# Patient Record
Sex: Female | Born: 1940 | Race: White | Hispanic: No | Marital: Married
Health system: Southern US, Community
[De-identification: ages and names within clinical notes are randomized; demographics above are authoritative.]

---

## 2006-01-22 ENCOUNTER — Ambulatory Visit: Payer: Self-pay | Admitting: Internal Medicine

## 2006-05-19 ENCOUNTER — Ambulatory Visit: Payer: Self-pay | Admitting: Gastroenterology

## 2007-01-26 ENCOUNTER — Ambulatory Visit: Payer: Self-pay | Admitting: Internal Medicine

## 2008-03-15 ENCOUNTER — Ambulatory Visit: Payer: Self-pay | Admitting: Internal Medicine

## 2008-09-08 ENCOUNTER — Ambulatory Visit: Payer: Self-pay | Admitting: Oncology

## 2008-09-18 ENCOUNTER — Ambulatory Visit: Payer: Self-pay | Admitting: Internal Medicine

## 2008-09-25 ENCOUNTER — Ambulatory Visit: Payer: Self-pay | Admitting: Internal Medicine

## 2008-09-28 ENCOUNTER — Ambulatory Visit: Payer: Self-pay | Admitting: Oncology

## 2008-10-09 ENCOUNTER — Ambulatory Visit: Payer: Self-pay | Admitting: Oncology

## 2008-11-06 ENCOUNTER — Ambulatory Visit: Payer: Self-pay | Admitting: Oncology

## 2008-12-07 ENCOUNTER — Ambulatory Visit: Payer: Self-pay | Admitting: Oncology

## 2009-01-06 ENCOUNTER — Ambulatory Visit: Payer: Self-pay | Admitting: Oncology

## 2009-02-06 ENCOUNTER — Ambulatory Visit: Payer: Self-pay | Admitting: Oncology

## 2009-02-14 ENCOUNTER — Ambulatory Visit: Payer: Self-pay | Admitting: Oncology

## 2009-02-16 ENCOUNTER — Ambulatory Visit: Payer: Self-pay | Admitting: Oncology

## 2009-03-08 ENCOUNTER — Ambulatory Visit: Payer: Self-pay | Admitting: Oncology

## 2009-03-19 ENCOUNTER — Ambulatory Visit: Payer: Self-pay | Admitting: Internal Medicine

## 2009-03-30 ENCOUNTER — Ambulatory Visit: Payer: Self-pay | Admitting: Oncology

## 2009-04-05 ENCOUNTER — Ambulatory Visit: Payer: Self-pay | Admitting: Gastroenterology

## 2009-04-08 ENCOUNTER — Ambulatory Visit: Payer: Self-pay | Admitting: Oncology

## 2009-05-09 ENCOUNTER — Ambulatory Visit: Payer: Self-pay | Admitting: Oncology

## 2009-05-18 ENCOUNTER — Ambulatory Visit: Payer: Self-pay | Admitting: Oncology

## 2009-05-23 ENCOUNTER — Ambulatory Visit: Payer: Self-pay | Admitting: Oncology

## 2009-06-08 ENCOUNTER — Ambulatory Visit: Payer: Self-pay | Admitting: Oncology

## 2009-08-08 ENCOUNTER — Ambulatory Visit: Payer: Self-pay | Admitting: Oncology

## 2009-08-22 ENCOUNTER — Ambulatory Visit: Payer: Self-pay | Admitting: Oncology

## 2009-09-08 ENCOUNTER — Ambulatory Visit: Payer: Self-pay | Admitting: Oncology

## 2009-11-06 ENCOUNTER — Ambulatory Visit: Payer: Self-pay | Admitting: Oncology

## 2009-11-20 ENCOUNTER — Ambulatory Visit: Payer: Self-pay | Admitting: Oncology

## 2009-11-22 ENCOUNTER — Ambulatory Visit: Payer: Self-pay | Admitting: Oncology

## 2009-12-07 ENCOUNTER — Ambulatory Visit: Payer: Self-pay | Admitting: Oncology

## 2010-02-06 ENCOUNTER — Ambulatory Visit: Payer: Self-pay | Admitting: Oncology

## 2010-03-01 ENCOUNTER — Ambulatory Visit: Payer: Self-pay | Admitting: Oncology

## 2010-03-08 ENCOUNTER — Ambulatory Visit: Payer: Self-pay | Admitting: Oncology

## 2010-03-20 ENCOUNTER — Ambulatory Visit: Payer: Self-pay | Admitting: Internal Medicine

## 2010-05-09 ENCOUNTER — Ambulatory Visit: Payer: Self-pay | Admitting: Oncology

## 2010-05-31 ENCOUNTER — Ambulatory Visit: Payer: Self-pay | Admitting: Oncology

## 2010-06-04 ENCOUNTER — Ambulatory Visit: Payer: Self-pay | Admitting: Oncology

## 2010-06-08 ENCOUNTER — Ambulatory Visit: Payer: Self-pay | Admitting: Oncology

## 2010-07-16 ENCOUNTER — Ambulatory Visit: Payer: Self-pay | Admitting: Oncology

## 2010-08-08 ENCOUNTER — Ambulatory Visit: Payer: Self-pay | Admitting: Oncology

## 2010-10-04 ENCOUNTER — Ambulatory Visit: Payer: Self-pay | Admitting: Oncology

## 2010-10-09 ENCOUNTER — Ambulatory Visit: Payer: Self-pay | Admitting: Oncology

## 2010-11-19 ENCOUNTER — Ambulatory Visit: Payer: Self-pay | Admitting: Oncology

## 2010-11-20 ENCOUNTER — Ambulatory Visit: Payer: Self-pay | Admitting: Oncology

## 2010-11-20 LAB — CA 125: CA 125: 26.8 U/mL (ref 0.0–34.0)

## 2010-12-08 ENCOUNTER — Ambulatory Visit: Payer: Self-pay | Admitting: Oncology

## 2011-01-07 ENCOUNTER — Ambulatory Visit: Payer: Self-pay | Admitting: Oncology

## 2011-02-07 ENCOUNTER — Ambulatory Visit: Payer: Self-pay | Admitting: Oncology

## 2011-03-09 ENCOUNTER — Ambulatory Visit: Payer: Self-pay | Admitting: Oncology

## 2011-03-28 ENCOUNTER — Ambulatory Visit: Payer: Self-pay | Admitting: Surgery

## 2011-04-09 ENCOUNTER — Ambulatory Visit: Payer: Self-pay | Admitting: Oncology

## 2011-04-23 ENCOUNTER — Ambulatory Visit: Payer: Self-pay | Admitting: Oncology

## 2011-05-10 ENCOUNTER — Ambulatory Visit: Payer: Self-pay | Admitting: Oncology

## 2011-06-09 ENCOUNTER — Ambulatory Visit: Payer: Self-pay | Admitting: Oncology

## 2011-06-19 ENCOUNTER — Inpatient Hospital Stay: Payer: Self-pay | Admitting: Oncology

## 2011-07-10 ENCOUNTER — Ambulatory Visit: Payer: Self-pay | Admitting: Oncology

## 2011-08-18 ENCOUNTER — Ambulatory Visit: Payer: Self-pay | Admitting: Oncology

## 2011-09-09 ENCOUNTER — Ambulatory Visit: Payer: Self-pay | Admitting: Oncology

## 2011-11-18 ENCOUNTER — Ambulatory Visit: Payer: Self-pay | Admitting: Oncology

## 2011-11-18 LAB — COMPREHENSIVE METABOLIC PANEL
Albumin: 4 g/dL (ref 3.4–5.0)
Alkaline Phosphatase: 144 U/L — ABNORMAL HIGH (ref 50–136)
Bilirubin,Total: 0.3 mg/dL (ref 0.2–1.0)
Calcium, Total: 8.8 mg/dL (ref 8.5–10.1)
Chloride: 104 mmol/L (ref 98–107)
Co2: 30 mmol/L (ref 21–32)
Creatinine: 0.75 mg/dL (ref 0.60–1.30)
EGFR (Non-African Amer.): >60
Glucose: 136 mg/dL — ABNORMAL HIGH (ref 65–99)
Osmolality: 285 (ref 275–301)
Sodium: 140 mmol/L (ref 136–145)
Total Protein: 7.4 g/dL (ref 6.4–8.2)

## 2011-11-18 LAB — CBC CANCER CENTER
Basophil #: 0 x10 3/mm (ref 0.0–0.1)
Eosinophil #: 0 x10 3/mm (ref 0.0–0.7)
HCT: 37.8 % (ref 35.0–47.0)
Lymphocyte %: 15.3 %
MCH: 30.9 pg (ref 26.0–34.0)
MCHC: 34 g/dL (ref 32.0–36.0)
Neutrophil #: 3.1 x10 3/mm (ref 1.4–6.5)
Platelet: 140 x10 3/mm — ABNORMAL LOW (ref 150–440)
RDW: 13.1 % (ref 11.5–14.5)
WBC: 4.3 x10 3/mm (ref 3.6–11.0)

## 2011-11-18 LAB — FERRITIN: Ferritin (ARMC): 227 ng/mL (ref 8–388)

## 2011-11-18 LAB — IRON AND TIBC
Iron Bind.Cap.(Total): 216 ug/dL — ABNORMAL LOW (ref 250–450)
Unbound Iron-Bind.Cap.: 163 ug/dL

## 2011-11-18 LAB — FOLATE: Folic Acid: 41.8 ng/mL (ref 3.1–100.0)

## 2011-12-08 ENCOUNTER — Ambulatory Visit: Payer: Self-pay | Admitting: Oncology

## 2011-12-16 ENCOUNTER — Ambulatory Visit: Payer: Self-pay | Admitting: Oncology

## 2012-01-04 ENCOUNTER — Inpatient Hospital Stay: Payer: Self-pay | Admitting: *Deleted

## 2012-01-04 LAB — COMPREHENSIVE METABOLIC PANEL
Alkaline Phosphatase: 67 U/L (ref 50–136)
BUN: 25 mg/dL — ABNORMAL HIGH (ref 7–18)
Bilirubin,Total: 0.4 mg/dL (ref 0.2–1.0)
Calcium, Total: 8.7 mg/dL (ref 8.5–10.1)
Chloride: 107 mmol/L (ref 98–107)
EGFR (African American): >60
SGOT(AST): 15 U/L (ref 15–37)
SGPT (ALT): 12 U/L
Sodium: 142 mmol/L (ref 136–145)

## 2012-01-04 LAB — CBC WITH DIFFERENTIAL/PLATELET
Basophil %: 0.8 %
HGB: 11.7 g/dL — ABNORMAL LOW (ref 12.0–16.0)
Lymphocyte #: 0.4 10*3/uL — ABNORMAL LOW (ref 1.0–3.6)
Lymphocyte %: 11.6 %
MCH: 29.3 pg (ref 26.0–34.0)
Monocyte %: 13.8 %
Neutrophil #: 2.5 10*3/uL (ref 1.4–6.5)
Platelet: 100 10*3/uL — ABNORMAL LOW (ref 150–440)
WBC: 3.4 10*3/uL — ABNORMAL LOW (ref 3.6–11.0)

## 2012-01-05 LAB — CBC WITH DIFFERENTIAL/PLATELET
HGB: 11.9 g/dL — ABNORMAL LOW (ref 12.0–16.0)
Lymphocyte #: 0.3 10*3/uL — ABNORMAL LOW (ref 1.0–3.6)
MCH: 29.8 pg (ref 26.0–34.0)
MCHC: 33 g/dL (ref 32.0–36.0)
Monocyte %: 8.1 %
Neutrophil #: 3.8 10*3/uL (ref 1.4–6.5)
RDW: 13.3 % (ref 11.5–14.5)

## 2012-01-05 LAB — BASIC METABOLIC PANEL
BUN: 19 mg/dL — ABNORMAL HIGH (ref 7–18)
Calcium, Total: 8 mg/dL — ABNORMAL LOW (ref 8.5–10.1)
Chloride: 109 mmol/L — ABNORMAL HIGH (ref 98–107)
EGFR (African American): >60
Glucose: 111 mg/dL — ABNORMAL HIGH (ref 65–99)
Osmolality: 286 (ref 275–301)
Sodium: 142 mmol/L (ref 136–145)

## 2012-01-06 LAB — BASIC METABOLIC PANEL
Anion Gap: 5 — ABNORMAL LOW (ref 7–16)
Co2: 29 mmol/L (ref 21–32)
Creatinine: 0.65 mg/dL (ref 0.60–1.30)
EGFR (African American): >60
EGFR (Non-African Amer.): >60
Glucose: 94 mg/dL (ref 65–99)
Osmolality: 282 (ref 275–301)
Potassium: 4.3 mmol/L (ref 3.5–5.1)
Sodium: 142 mmol/L (ref 136–145)

## 2012-01-07 ENCOUNTER — Ambulatory Visit: Payer: Self-pay | Admitting: Oncology

## 2012-01-08 ENCOUNTER — Ambulatory Visit: Payer: Self-pay | Admitting: Oncology

## 2012-01-10 LAB — CULTURE, BLOOD (SINGLE)

## 2012-01-14 LAB — COMPREHENSIVE METABOLIC PANEL
Albumin: 3.3 g/dL — ABNORMAL LOW (ref 3.4–5.0)
Alkaline Phosphatase: 78 U/L (ref 50–136)
Anion Gap: 8 (ref 7–16)
BUN: 20 mg/dL — ABNORMAL HIGH (ref 7–18)
Bilirubin,Total: 0.3 mg/dL (ref 0.2–1.0)
Calcium, Total: 9.2 mg/dL (ref 8.5–10.1)
Chloride: 104 mmol/L (ref 98–107)
Co2: 31 mmol/L (ref 21–32)
EGFR (African American): >60
Glucose: 81 mg/dL (ref 65–99)
Osmolality: 287 (ref 275–301)
SGOT(AST): 11 U/L — ABNORMAL LOW (ref 15–37)
SGPT (ALT): 15 U/L
Sodium: 143 mmol/L (ref 136–145)
Total Protein: 6.6 g/dL (ref 6.4–8.2)

## 2012-01-14 LAB — CBC CANCER CENTER
Basophil #: 0 x10 3/mm (ref 0.0–0.1)
HCT: 37.5 % (ref 35.0–47.0)
Lymphocyte #: 0.6 x10 3/mm — ABNORMAL LOW (ref 1.0–3.6)
Lymphocyte %: 10.6 %
MCH: 29.2 pg (ref 26.0–34.0)
MCHC: 32.7 g/dL (ref 32.0–36.0)
MCV: 90 fL (ref 80–100)
Monocyte %: 10.8 %
Neutrophil #: 4.8 x10 3/mm (ref 1.4–6.5)
Neutrophil %: 77.7 %
Platelet: 193 x10 3/mm (ref 150–440)
RBC: 4.19 10*6/uL (ref 3.80–5.20)
WBC: 6.1 x10 3/mm (ref 3.6–11.0)

## 2012-02-07 ENCOUNTER — Ambulatory Visit: Payer: Self-pay | Admitting: Oncology

## 2012-02-12 ENCOUNTER — Ambulatory Visit: Payer: Self-pay | Admitting: Internal Medicine

## 2012-03-08 ENCOUNTER — Ambulatory Visit: Payer: Self-pay | Admitting: Oncology

## 2012-03-10 ENCOUNTER — Ambulatory Visit: Payer: Self-pay | Admitting: Oncology

## 2012-03-10 LAB — CBC CANCER CENTER
Basophil #: 0 x10 3/mm (ref 0.0–0.1)
HCT: 43.6 % (ref 35.0–47.0)
HGB: 13.9 g/dL (ref 12.0–16.0)
Lymphocyte #: 0.6 x10 3/mm — ABNORMAL LOW (ref 1.0–3.6)
Lymphocyte %: 8.5 %
MCH: 28.4 pg (ref 26.0–34.0)
Monocyte %: 6.9 %
Neutrophil %: 83.8 %
Platelet: 151 x10 3/mm (ref 150–440)
RBC: 4.9 10*6/uL (ref 3.80–5.20)
RDW: 14.7 % — ABNORMAL HIGH (ref 11.5–14.5)
WBC: 6.5 x10 3/mm (ref 3.6–11.0)

## 2012-03-10 LAB — COMPREHENSIVE METABOLIC PANEL
Albumin: 3.8 g/dL (ref 3.4–5.0)
Anion Gap: 6 — ABNORMAL LOW (ref 7–16)
BUN: 22 mg/dL — ABNORMAL HIGH (ref 7–18)
Calcium, Total: 9.3 mg/dL (ref 8.5–10.1)
Glucose: 137 mg/dL — ABNORMAL HIGH (ref 65–99)
Osmolality: 279 (ref 275–301)
Potassium: 4 mmol/L (ref 3.5–5.1)
SGOT(AST): 17 U/L (ref 15–37)
Sodium: 137 mmol/L (ref 136–145)
Total Protein: 6.9 g/dL (ref 6.4–8.2)

## 2012-04-07 LAB — COMPREHENSIVE METABOLIC PANEL
Albumin: 4 g/dL (ref 3.4–5.0)
Anion Gap: 8 (ref 7–16)
BUN: 25 mg/dL — ABNORMAL HIGH (ref 7–18)
Calcium, Total: 10.2 mg/dL — ABNORMAL HIGH (ref 8.5–10.1)
Chloride: 99 mmol/L (ref 98–107)
Co2: 30 mmol/L (ref 21–32)
Creatinine: 1.18 mg/dL (ref 0.60–1.30)
EGFR (African American): 54 — ABNORMAL LOW
Glucose: 91 mg/dL (ref 65–99)
Osmolality: 278 (ref 275–301)
Potassium: 4.5 mmol/L (ref 3.5–5.1)
SGOT(AST): 18 U/L (ref 15–37)
Sodium: 137 mmol/L (ref 136–145)

## 2012-04-07 LAB — CBC CANCER CENTER
Basophil #: 0 x10 3/mm (ref 0.0–0.1)
Basophil %: 0.6 %
Eosinophil #: 0.1 x10 3/mm (ref 0.0–0.7)
HCT: 45.7 % (ref 35.0–47.0)
Lymphocyte %: 13.2 %
MCHC: 32.8 g/dL (ref 32.0–36.0)
Monocyte %: 10.9 %
Neutrophil %: 74.4 %
RBC: 5.15 10*6/uL (ref 3.80–5.20)
WBC: 7.3 x10 3/mm (ref 3.6–11.0)

## 2012-04-08 ENCOUNTER — Ambulatory Visit: Payer: Self-pay | Admitting: Oncology

## 2012-05-12 ENCOUNTER — Ambulatory Visit: Payer: Self-pay | Admitting: Oncology

## 2012-05-12 LAB — COMPREHENSIVE METABOLIC PANEL
Albumin: 3.8 g/dL (ref 3.4–5.0)
Alkaline Phosphatase: 85 U/L (ref 50–136)
Anion Gap: 5 — ABNORMAL LOW (ref 7–16)
BUN: 28 mg/dL — ABNORMAL HIGH (ref 7–18)
Bilirubin,Total: 0.8 mg/dL (ref 0.2–1.0)
Co2: 31 mmol/L (ref 21–32)
Creatinine: 1.39 mg/dL — ABNORMAL HIGH (ref 0.60–1.30)
Glucose: 101 mg/dL — ABNORMAL HIGH (ref 65–99)
Osmolality: 279 (ref 275–301)
SGOT(AST): 19 U/L (ref 15–37)
SGPT (ALT): 16 U/L (ref 12–78)
Total Protein: 6.9 g/dL (ref 6.4–8.2)

## 2012-05-12 LAB — CBC CANCER CENTER
Basophil #: 0 x10 3/mm (ref 0.0–0.1)
Eosinophil #: 0 x10 3/mm (ref 0.0–0.7)
MCH: 29.2 pg (ref 26.0–34.0)
MCHC: 34 g/dL (ref 32.0–36.0)
Monocyte #: 0.7 x10 3/mm (ref 0.2–0.9)
Neutrophil #: 4.9 x10 3/mm (ref 1.4–6.5)
Neutrophil %: 75.2 %
Platelet: 182 x10 3/mm (ref 150–440)
RBC: 4.85 10*6/uL (ref 3.80–5.20)
RDW: 14.3 % (ref 11.5–14.5)

## 2012-06-08 ENCOUNTER — Ambulatory Visit: Payer: Self-pay | Admitting: Oncology

## 2012-06-09 LAB — CBC CANCER CENTER
Basophil %: 0.3 %
Eosinophil #: 0 x10 3/mm (ref 0.0–0.7)
HGB: 12.7 g/dL (ref 12.0–16.0)
MCH: 28.4 pg (ref 26.0–34.0)
Monocyte #: 0.4 x10 3/mm (ref 0.2–0.9)
Neutrophil #: 5.4 x10 3/mm (ref 1.4–6.5)
Neutrophil %: 87.8 %
Platelet: 187 x10 3/mm (ref 150–440)
RBC: 4.48 10*6/uL (ref 3.80–5.20)
WBC: 6.1 x10 3/mm (ref 3.6–11.0)

## 2012-06-09 LAB — COMPREHENSIVE METABOLIC PANEL
Albumin: 3.3 g/dL — ABNORMAL LOW (ref 3.4–5.0)
Alkaline Phosphatase: 100 U/L (ref 50–136)
BUN: 22 mg/dL — ABNORMAL HIGH (ref 7–18)
Chloride: 103 mmol/L (ref 98–107)
Co2: 25 mmol/L (ref 21–32)
Creatinine: 0.93 mg/dL (ref 0.60–1.30)
EGFR (African American): >60
EGFR (Non-African Amer.): >60
Osmolality: 280 (ref 275–301)
Potassium: 4.4 mmol/L (ref 3.5–5.1)
SGOT(AST): 19 U/L (ref 15–37)
SGPT (ALT): 18 U/L (ref 12–78)
Total Protein: 6.2 g/dL — ABNORMAL LOW (ref 6.4–8.2)

## 2012-07-05 ENCOUNTER — Ambulatory Visit: Payer: Self-pay | Admitting: Oncology

## 2012-07-07 LAB — CBC CANCER CENTER
Eosinophil #: 0.1 x10 3/mm (ref 0.0–0.7)
HCT: 39.7 % (ref 35.0–47.0)
HGB: 12.6 g/dL (ref 12.0–16.0)
Lymphocyte #: 0.8 x10 3/mm — ABNORMAL LOW (ref 1.0–3.6)
Lymphocyte %: 12.3 %
MCH: 28.4 pg (ref 26.0–34.0)
MCHC: 31.6 g/dL — ABNORMAL LOW (ref 32.0–36.0)
MCV: 90 fL (ref 80–100)
Monocyte #: 0.6 x10 3/mm (ref 0.2–0.9)
Monocyte %: 10.3 %
Neutrophil #: 4.6 x10 3/mm (ref 1.4–6.5)
Platelet: 187 x10 3/mm (ref 150–440)
RBC: 4.43 10*6/uL (ref 3.80–5.20)
RDW: 14.8 % — ABNORMAL HIGH (ref 11.5–14.5)
WBC: 6.1 x10 3/mm (ref 3.6–11.0)

## 2012-07-07 LAB — COMPREHENSIVE METABOLIC PANEL
Alkaline Phosphatase: 81 U/L (ref 50–136)
Anion Gap: 8 (ref 7–16)
BUN: 29 mg/dL — ABNORMAL HIGH (ref 7–18)
Bilirubin,Total: 0.6 mg/dL (ref 0.2–1.0)
Calcium, Total: 8.8 mg/dL (ref 8.5–10.1)
Chloride: 106 mmol/L (ref 98–107)
Co2: 28 mmol/L (ref 21–32)
Creatinine: 0.93 mg/dL (ref 0.60–1.30)
EGFR (African American): >60
Osmolality: 290 (ref 275–301)
Potassium: 4 mmol/L (ref 3.5–5.1)
SGOT(AST): 19 U/L (ref 15–37)
Total Protein: 6.1 g/dL — ABNORMAL LOW (ref 6.4–8.2)

## 2012-07-09 ENCOUNTER — Ambulatory Visit: Payer: Self-pay | Admitting: Oncology

## 2012-08-04 LAB — COMPREHENSIVE METABOLIC PANEL
Anion Gap: 4 — ABNORMAL LOW (ref 7–16)
BUN: 24 mg/dL — ABNORMAL HIGH (ref 7–18)
Calcium, Total: 9.1 mg/dL (ref 8.5–10.1)
Chloride: 105 mmol/L (ref 98–107)
Co2: 31 mmol/L (ref 21–32)
EGFR (African American): >60
EGFR (Non-African Amer.): 56 — ABNORMAL LOW
Potassium: 4 mmol/L (ref 3.5–5.1)
SGOT(AST): 24 U/L (ref 15–37)
Total Protein: 6.3 g/dL — ABNORMAL LOW (ref 6.4–8.2)

## 2012-08-04 LAB — CBC CANCER CENTER
Basophil #: 0 x10 3/mm (ref 0.0–0.1)
Basophil %: 0.4 %
Eosinophil #: 0 x10 3/mm (ref 0.0–0.7)
Lymphocyte #: 1 x10 3/mm (ref 1.0–3.6)
Lymphocyte %: 15 %
MCH: 29.5 pg (ref 26.0–34.0)
MCHC: 33.2 g/dL (ref 32.0–36.0)
Monocyte #: 0.8 x10 3/mm (ref 0.2–0.9)
Neutrophil %: 72 %
Platelet: 205 x10 3/mm (ref 150–440)
RBC: 4.44 10*6/uL (ref 3.80–5.20)
RDW: 14.7 % — ABNORMAL HIGH (ref 11.5–14.5)

## 2012-08-08 ENCOUNTER — Ambulatory Visit: Payer: Self-pay | Admitting: Oncology

## 2012-09-06 LAB — CBC CANCER CENTER
Basophil #: 0 x10 3/mm (ref 0.0–0.1)
Basophil %: 0.6 %
Eosinophil #: 0.1 x10 3/mm (ref 0.0–0.7)
HCT: 35.8 % (ref 35.0–47.0)
Lymphocyte #: 1.1 x10 3/mm (ref 1.0–3.6)
Lymphocyte %: 16.8 %
MCH: 29.3 pg (ref 26.0–34.0)
MCHC: 33.4 g/dL (ref 32.0–36.0)
MCV: 88 fL (ref 80–100)
Monocyte #: 0.9 x10 3/mm (ref 0.2–0.9)
Neutrophil #: 4.6 x10 3/mm (ref 1.4–6.5)
RDW: 14.2 % (ref 11.5–14.5)
WBC: 6.8 x10 3/mm (ref 3.6–11.0)

## 2012-09-06 LAB — COMPREHENSIVE METABOLIC PANEL
Anion Gap: 7 (ref 7–16)
BUN: 32 mg/dL — ABNORMAL HIGH (ref 7–18)
Chloride: 107 mmol/L (ref 98–107)
Co2: 28 mmol/L (ref 21–32)
EGFR (African American): 59 — ABNORMAL LOW
EGFR (Non-African Amer.): 51 — ABNORMAL LOW
Potassium: 4.2 mmol/L (ref 3.5–5.1)
SGOT(AST): 22 U/L (ref 15–37)
SGPT (ALT): 21 U/L (ref 12–78)
Sodium: 142 mmol/L (ref 136–145)
Total Protein: 5.6 g/dL — ABNORMAL LOW (ref 6.4–8.2)

## 2012-09-08 ENCOUNTER — Ambulatory Visit: Payer: Self-pay | Admitting: Oncology

## 2012-10-07 LAB — COMPREHENSIVE METABOLIC PANEL
Albumin: 3 g/dL — ABNORMAL LOW (ref 3.4–5.0)
Alkaline Phosphatase: 115 U/L (ref 50–136)
Anion Gap: 9 (ref 7–16)
BUN: 33 mg/dL — ABNORMAL HIGH (ref 7–18)
Bilirubin,Total: 0.9 mg/dL (ref 0.2–1.0)
Calcium, Total: 8.4 mg/dL — ABNORMAL LOW (ref 8.5–10.1)
Chloride: 103 mmol/L (ref 98–107)
Glucose: 115 mg/dL — ABNORMAL HIGH (ref 65–99)
Osmolality: 288 (ref 275–301)
Potassium: 4.5 mmol/L (ref 3.5–5.1)
SGPT (ALT): 22 U/L (ref 12–78)
Sodium: 140 mmol/L (ref 136–145)
Total Protein: 6.4 g/dL (ref 6.4–8.2)

## 2012-10-07 LAB — CBC CANCER CENTER
Lymphocyte %: 16.1 %
MCHC: 32.5 g/dL (ref 32.0–36.0)
MCV: 89 fL (ref 80–100)
Monocyte #: 1.7 x10 3/mm — ABNORMAL HIGH (ref 0.2–0.9)
Monocyte %: 16 %
Neutrophil #: 7 x10 3/mm — ABNORMAL HIGH (ref 1.4–6.5)
Neutrophil %: 66.9 %
Platelet: 216 x10 3/mm (ref 150–440)
RDW: 14.7 % — ABNORMAL HIGH (ref 11.5–14.5)
WBC: 10.4 x10 3/mm (ref 3.6–11.0)

## 2012-10-09 ENCOUNTER — Ambulatory Visit: Payer: Self-pay | Admitting: Oncology

## 2012-10-11 ENCOUNTER — Ambulatory Visit: Payer: Self-pay | Admitting: Oncology

## 2012-10-12 ENCOUNTER — Ambulatory Visit: Payer: Self-pay | Admitting: Oncology

## 2012-11-04 LAB — COMPREHENSIVE METABOLIC PANEL
Albumin: 2.6 g/dL — ABNORMAL LOW (ref 3.4–5.0)
Alkaline Phosphatase: 118 U/L (ref 50–136)
Anion Gap: 5 — ABNORMAL LOW (ref 7–16)
Calcium, Total: 9.4 mg/dL (ref 8.5–10.1)
Chloride: 103 mmol/L (ref 98–107)
Co2: 32 mmol/L (ref 21–32)
Creatinine: 1.29 mg/dL (ref 0.60–1.30)
EGFR (African American): 49 — ABNORMAL LOW
EGFR (Non-African Amer.): 42 — ABNORMAL LOW
Glucose: 88 mg/dL (ref 65–99)
Osmolality: 284 (ref 275–301)
Potassium: 4.4 mmol/L (ref 3.5–5.1)
SGOT(AST): 23 U/L (ref 15–37)
SGPT (ALT): 24 U/L (ref 12–78)
Sodium: 140 mmol/L (ref 136–145)
Total Protein: 6 g/dL — ABNORMAL LOW (ref 6.4–8.2)

## 2012-11-04 LAB — CBC CANCER CENTER
Basophil #: 0.1 x10 3/mm (ref 0.0–0.1)
Basophil %: 0.7 %
HGB: 13.4 g/dL (ref 12.0–16.0)
Lymphocyte #: 1.3 x10 3/mm (ref 1.0–3.6)
Lymphocyte %: 14.8 %
MCH: 29.2 pg (ref 26.0–34.0)
MCV: 88 fL (ref 80–100)
Monocyte #: 1.2 x10 3/mm — ABNORMAL HIGH (ref 0.2–0.9)
Monocyte %: 13.6 %
Neutrophil #: 6 x10 3/mm (ref 1.4–6.5)
Neutrophil %: 70 %
Platelet: 207 x10 3/mm (ref 150–440)
RDW: 15.5 % — ABNORMAL HIGH (ref 11.5–14.5)
WBC: 8.6 x10 3/mm (ref 3.6–11.0)

## 2012-11-06 ENCOUNTER — Ambulatory Visit: Payer: Self-pay | Admitting: Oncology

## 2012-12-02 LAB — CBC CANCER CENTER
Basophil #: 0 x10 3/mm (ref 0.0–0.1)
Basophil %: 0.7 %
Eosinophil #: 0.1 x10 3/mm (ref 0.0–0.7)
Eosinophil %: 1.1 %
HCT: 39.4 % (ref 35.0–47.0)
HGB: 12.9 g/dL (ref 12.0–16.0)
Lymphocyte %: 8 %
MCH: 29.1 pg (ref 26.0–34.0)
MCHC: 32.7 g/dL (ref 32.0–36.0)
MCV: 89 fL (ref 80–100)
Monocyte #: 0.7 x10 3/mm (ref 0.2–0.9)
Monocyte %: 9.7 %
Neutrophil #: 5.8 x10 3/mm (ref 1.4–6.5)
Platelet: 205 x10 3/mm (ref 150–440)
RDW: 15 % — ABNORMAL HIGH (ref 11.5–14.5)
WBC: 7.3 x10 3/mm (ref 3.6–11.0)

## 2012-12-02 LAB — COMPREHENSIVE METABOLIC PANEL
Alkaline Phosphatase: 129 U/L (ref 50–136)
Anion Gap: 8 (ref 7–16)
Bilirubin,Total: 0.6 mg/dL (ref 0.2–1.0)
Co2: 28 mmol/L (ref 21–32)
Creatinine: 1.51 mg/dL — ABNORMAL HIGH (ref 0.60–1.30)
EGFR (African American): 40 — ABNORMAL LOW
Glucose: 106 mg/dL — ABNORMAL HIGH (ref 65–99)
Osmolality: 292 (ref 275–301)
Potassium: 3.6 mmol/L (ref 3.5–5.1)
SGOT(AST): 23 U/L (ref 15–37)
Sodium: 142 mmol/L (ref 136–145)

## 2012-12-07 ENCOUNTER — Ambulatory Visit: Payer: Self-pay | Admitting: Oncology

## 2012-12-20 LAB — CBC CANCER CENTER
Basophil %: 0.4 %
Eosinophil #: 0 x10 3/mm (ref 0.0–0.7)
Eosinophil %: 0.3 %
HCT: 41.1 % (ref 35.0–47.0)
Lymphocyte #: 0.7 x10 3/mm — ABNORMAL LOW (ref 1.0–3.6)
Lymphocyte %: 6.7 %
MCH: 29 pg (ref 26.0–34.0)
Monocyte #: 0.9 x10 3/mm (ref 0.2–0.9)
Monocyte %: 9.1 %
Neutrophil #: 8.2 x10 3/mm — ABNORMAL HIGH (ref 1.4–6.5)
Neutrophil %: 83.5 %
Platelet: 199 x10 3/mm (ref 150–440)
RBC: 4.58 10*6/uL (ref 3.80–5.20)
RDW: 15.4 % — ABNORMAL HIGH (ref 11.5–14.5)
WBC: 9.8 x10 3/mm (ref 3.6–11.0)

## 2012-12-20 LAB — COMPREHENSIVE METABOLIC PANEL
Anion Gap: 8 (ref 7–16)
BUN: 32 mg/dL — ABNORMAL HIGH (ref 7–18)
Bilirubin,Total: 0.9 mg/dL (ref 0.2–1.0)
Chloride: 106 mmol/L (ref 98–107)
Creatinine: 1.37 mg/dL — ABNORMAL HIGH (ref 0.60–1.30)
EGFR (Non-African Amer.): 39 — ABNORMAL LOW
Glucose: 135 mg/dL — ABNORMAL HIGH (ref 65–99)
Osmolality: 288 (ref 275–301)
SGOT(AST): 26 U/L (ref 15–37)
Sodium: 140 mmol/L (ref 136–145)

## 2013-01-05 LAB — COMPREHENSIVE METABOLIC PANEL
Albumin: 2.5 g/dL — ABNORMAL LOW (ref 3.4–5.0)
Alkaline Phosphatase: 78 U/L (ref 50–136)
Anion Gap: 5 — ABNORMAL LOW (ref 7–16)
Bilirubin,Total: 0.3 mg/dL (ref 0.2–1.0)
Co2: 33 mmol/L — ABNORMAL HIGH (ref 21–32)
Creatinine: 1.09 mg/dL (ref 0.60–1.30)
EGFR (African American): 59 — ABNORMAL LOW
SGOT(AST): 26 U/L (ref 15–37)
SGPT (ALT): 24 U/L (ref 12–78)
Total Protein: 6.7 g/dL (ref 6.4–8.2)

## 2013-01-05 LAB — CBC CANCER CENTER
Eosinophil #: 0.1 x10 3/mm (ref 0.0–0.7)
HCT: 34.9 % — ABNORMAL LOW (ref 35.0–47.0)
Lymphocyte #: 0.3 x10 3/mm — ABNORMAL LOW (ref 1.0–3.6)
Lymphocyte %: 2.3 %
MCH: 29.2 pg (ref 26.0–34.0)
MCHC: 32.5 g/dL (ref 32.0–36.0)
Monocyte %: 7.3 %
Neutrophil %: 88.9 %
RBC: 3.89 10*6/uL (ref 3.80–5.20)
WBC: 14 x10 3/mm — ABNORMAL HIGH (ref 3.6–11.0)

## 2013-01-06 ENCOUNTER — Ambulatory Visit: Payer: Self-pay | Admitting: Oncology

## 2013-02-02 LAB — COMPREHENSIVE METABOLIC PANEL
Alkaline Phosphatase: 76 U/L (ref 50–136)
Creatinine: 1.7 mg/dL — ABNORMAL HIGH (ref 0.60–1.30)
EGFR (African American): 35 — ABNORMAL LOW
EGFR (Non-African Amer.): 30 — ABNORMAL LOW
Glucose: 82 mg/dL (ref 65–99)
Osmolality: 284 (ref 275–301)
SGOT(AST): 14 U/L — ABNORMAL LOW (ref 15–37)
SGPT (ALT): 17 U/L (ref 12–78)
Sodium: 138 mmol/L (ref 136–145)

## 2013-02-02 LAB — CBC CANCER CENTER
Basophil #: 0.1 x10 3/mm (ref 0.0–0.1)
Eosinophil #: 0.2 x10 3/mm (ref 0.0–0.7)
Eosinophil %: 2.3 %
Lymphocyte %: 9.8 %
MCHC: 32.3 g/dL (ref 32.0–36.0)
MCV: 90 fL (ref 80–100)
Monocyte #: 1.1 x10 3/mm — ABNORMAL HIGH (ref 0.2–0.9)
Neutrophil %: 76.4 %
Platelet: 271 x10 3/mm (ref 150–440)
RBC: 3.94 10*6/uL (ref 3.80–5.20)

## 2013-02-06 ENCOUNTER — Ambulatory Visit: Payer: Self-pay | Admitting: Oncology

## 2013-03-02 LAB — CBC CANCER CENTER
Basophil %: 0.7 %
HGB: 12.8 g/dL (ref 12.0–16.0)
Lymphocyte #: 0.7 x10 3/mm — ABNORMAL LOW (ref 1.0–3.6)
MCH: 30.4 pg (ref 26.0–34.0)
Monocyte #: 0.7 x10 3/mm (ref 0.2–0.9)
Neutrophil %: 86.2 %
Platelet: 214 x10 3/mm (ref 150–440)
RBC: 4.23 10*6/uL (ref 3.80–5.20)
WBC: 10.9 x10 3/mm (ref 3.6–11.0)

## 2013-03-02 LAB — COMPREHENSIVE METABOLIC PANEL
Albumin: 3.4 g/dL (ref 3.4–5.0)
Alkaline Phosphatase: 78 U/L (ref 50–136)
Anion Gap: 9 (ref 7–16)
BUN: 44 mg/dL — ABNORMAL HIGH (ref 7–18)
Bilirubin,Total: 0.3 mg/dL (ref 0.2–1.0)
Co2: 33 mmol/L — ABNORMAL HIGH (ref 21–32)
Creatinine: 2.27 mg/dL — ABNORMAL HIGH (ref 0.60–1.30)
EGFR (African American): 24 — ABNORMAL LOW
Osmolality: 293 (ref 275–301)
SGOT(AST): 13 U/L — ABNORMAL LOW (ref 15–37)
SGPT (ALT): 17 U/L (ref 12–78)
Sodium: 140 mmol/L (ref 136–145)
Total Protein: 7 g/dL (ref 6.4–8.2)

## 2013-03-08 ENCOUNTER — Ambulatory Visit: Payer: Self-pay | Admitting: Oncology

## 2013-03-16 LAB — CBC CANCER CENTER
Basophil #: 0.1 x10 3/mm (ref 0.0–0.1)
Basophil %: 0.7 %
Eosinophil #: 0.1 x10 3/mm (ref 0.0–0.7)
HGB: 12.3 g/dL (ref 12.0–16.0)
Lymphocyte #: 0.7 x10 3/mm — ABNORMAL LOW (ref 1.0–3.6)
Lymphocyte %: 6.9 %
MCH: 29.7 pg (ref 26.0–34.0)
MCV: 88 fL (ref 80–100)
Monocyte #: 0.9 x10 3/mm (ref 0.2–0.9)
Monocyte %: 9.1 %
Neutrophil %: 82.7 %
Platelet: 222 x10 3/mm (ref 150–440)
RBC: 4.14 10*6/uL (ref 3.80–5.20)
RDW: 15.6 % — ABNORMAL HIGH (ref 11.5–14.5)
WBC: 10.3 x10 3/mm (ref 3.6–11.0)

## 2013-03-16 LAB — COMPREHENSIVE METABOLIC PANEL
Anion Gap: 9 (ref 7–16)
BUN: 29 mg/dL — ABNORMAL HIGH (ref 7–18)
Bilirubin,Total: 0.3 mg/dL (ref 0.2–1.0)
Calcium, Total: 8 mg/dL — ABNORMAL LOW (ref 8.5–10.1)
Chloride: 104 mmol/L (ref 98–107)
Co2: 27 mmol/L (ref 21–32)
Creatinine: 1.36 mg/dL — ABNORMAL HIGH (ref 0.60–1.30)
EGFR (African American): 45 — ABNORMAL LOW
Osmolality: 286 (ref 275–301)
Potassium: 3.1 mmol/L — ABNORMAL LOW (ref 3.5–5.1)
SGOT(AST): 21 U/L (ref 15–37)
SGPT (ALT): 18 U/L (ref 12–78)
Total Protein: 7.4 g/dL (ref 6.4–8.2)

## 2013-03-31 LAB — COMPREHENSIVE METABOLIC PANEL
Alkaline Phosphatase: 77 U/L (ref 50–136)
Anion Gap: 6 — ABNORMAL LOW (ref 7–16)
Calcium, Total: 9.5 mg/dL (ref 8.5–10.1)
Chloride: 103 mmol/L (ref 98–107)
Co2: 33 mmol/L — ABNORMAL HIGH (ref 21–32)
Creatinine: 1.93 mg/dL — ABNORMAL HIGH (ref 0.60–1.30)
Potassium: 3.8 mmol/L (ref 3.5–5.1)
SGOT(AST): 12 U/L — ABNORMAL LOW (ref 15–37)
Sodium: 142 mmol/L (ref 136–145)

## 2013-03-31 LAB — CBC CANCER CENTER
Basophil %: 0.7 %
Eosinophil %: 1.2 %
HCT: 37.8 % (ref 35.0–47.0)
HGB: 12.8 g/dL (ref 12.0–16.0)
Lymphocyte #: 0.8 x10 3/mm — ABNORMAL LOW (ref 1.0–3.6)
Lymphocyte %: 8.6 %
Monocyte #: 1.1 x10 3/mm — ABNORMAL HIGH (ref 0.2–0.9)
Monocyte %: 11.7 %
Neutrophil %: 77.8 %
Platelet: 217 x10 3/mm (ref 150–440)
RDW: 15.4 % — ABNORMAL HIGH (ref 11.5–14.5)

## 2013-04-08 ENCOUNTER — Ambulatory Visit: Payer: Self-pay | Admitting: Oncology

## 2013-04-27 LAB — COMPREHENSIVE METABOLIC PANEL
Albumin: 3.6 g/dL (ref 3.4–5.0)
Alkaline Phosphatase: 90 U/L (ref 50–136)
Anion Gap: 7 (ref 7–16)
BUN: 27 mg/dL — ABNORMAL HIGH (ref 7–18)
Bilirubin,Total: 0.4 mg/dL (ref 0.2–1.0)
Calcium, Total: 9.1 mg/dL (ref 8.5–10.1)
Chloride: 99 mmol/L (ref 98–107)
Creatinine: 1.45 mg/dL — ABNORMAL HIGH (ref 0.60–1.30)
EGFR (African American): 42 — ABNORMAL LOW
EGFR (Non-African Amer.): 36 — ABNORMAL LOW
Glucose: 94 mg/dL (ref 65–99)
Sodium: 140 mmol/L (ref 136–145)
Total Protein: 7 g/dL (ref 6.4–8.2)

## 2013-04-27 LAB — CBC CANCER CENTER
Basophil %: 1.1 %
Eosinophil #: 0.2 x10 3/mm (ref 0.0–0.7)
Eosinophil %: 2 %
HGB: 12.3 g/dL (ref 12.0–16.0)
Lymphocyte #: 0.8 x10 3/mm — ABNORMAL LOW (ref 1.0–3.6)
Lymphocyte %: 9.8 %
MCH: 29.4 pg (ref 26.0–34.0)
Monocyte #: 1 x10 3/mm — ABNORMAL HIGH (ref 0.2–0.9)
WBC: 8.6 x10 3/mm (ref 3.6–11.0)

## 2013-05-09 ENCOUNTER — Ambulatory Visit: Payer: Self-pay | Admitting: Oncology

## 2013-06-08 ENCOUNTER — Ambulatory Visit: Payer: Self-pay | Admitting: Oncology

## 2013-06-22 LAB — COMPREHENSIVE METABOLIC PANEL
Alkaline Phosphatase: 144 U/L — ABNORMAL HIGH (ref 50–136)
Anion Gap: 7 (ref 7–16)
BUN: 32 mg/dL — ABNORMAL HIGH (ref 7–18)
Co2: 36 mmol/L — ABNORMAL HIGH (ref 21–32)
Creatinine: 1.79 mg/dL — ABNORMAL HIGH (ref 0.60–1.30)
EGFR (African American): 32 — ABNORMAL LOW
EGFR (Non-African Amer.): 28 — ABNORMAL LOW
Glucose: 106 mg/dL — ABNORMAL HIGH (ref 65–99)
Osmolality: 279 (ref 275–301)
SGPT (ALT): 28 U/L (ref 12–78)
Total Protein: 7.2 g/dL (ref 6.4–8.2)

## 2013-06-22 LAB — CBC CANCER CENTER
Basophil #: 0.1 x10 3/mm (ref 0.0–0.1)
Eosinophil #: 0.2 x10 3/mm (ref 0.0–0.7)
Eosinophil %: 3.1 %
HCT: 35.6 % (ref 35.0–47.0)
Lymphocyte #: 0.7 x10 3/mm — ABNORMAL LOW (ref 1.0–3.6)
Lymphocyte %: 8.4 %
Monocyte #: 1.2 x10 3/mm — ABNORMAL HIGH (ref 0.2–0.9)
Monocyte %: 14.7 %
Neutrophil #: 5.8 x10 3/mm (ref 1.4–6.5)
Platelet: 256 x10 3/mm (ref 150–440)
RDW: 14.9 % — ABNORMAL HIGH (ref 11.5–14.5)
WBC: 7.9 x10 3/mm (ref 3.6–11.0)

## 2013-07-09 ENCOUNTER — Ambulatory Visit: Payer: Self-pay | Admitting: Oncology

## 2013-07-18 LAB — CBC CANCER CENTER
Basophil #: 0 x10 3/mm (ref 0.0–0.1)
Basophil %: 0.5 %
Eosinophil %: 2.9 %
HCT: 38.6 % (ref 35.0–47.0)
HGB: 12.3 g/dL (ref 12.0–16.0)
Lymphocyte %: 7.1 %
Monocyte %: 8.9 %
Neutrophil %: 80.6 %
Platelet: 176 x10 3/mm (ref 150–440)
RBC: 4.48 10*6/uL (ref 3.80–5.20)
RDW: 15.3 % — ABNORMAL HIGH (ref 11.5–14.5)
WBC: 8.4 x10 3/mm (ref 3.6–11.0)

## 2013-07-18 LAB — COMPREHENSIVE METABOLIC PANEL
Alkaline Phosphatase: 120 U/L (ref 50–136)
Anion Gap: 6 — ABNORMAL LOW (ref 7–16)
BUN: 17 mg/dL (ref 7–18)
Bilirubin,Total: 0.3 mg/dL (ref 0.2–1.0)
Calcium, Total: 8.6 mg/dL (ref 8.5–10.1)
Chloride: 103 mmol/L (ref 98–107)
EGFR (African American): 46 — ABNORMAL LOW
Glucose: 122 mg/dL — ABNORMAL HIGH (ref 65–99)
Osmolality: 284 (ref 275–301)
Potassium: 3.8 mmol/L (ref 3.5–5.1)
SGOT(AST): 24 U/L (ref 15–37)
Sodium: 141 mmol/L (ref 136–145)

## 2013-08-08 ENCOUNTER — Ambulatory Visit: Payer: Self-pay | Admitting: Oncology

## 2013-08-15 LAB — COMPREHENSIVE METABOLIC PANEL
Anion Gap: 4 — ABNORMAL LOW (ref 7–16)
Bilirubin,Total: 0.3 mg/dL (ref 0.2–1.0)
Calcium, Total: 9.2 mg/dL (ref 8.5–10.1)
Creatinine: 1.46 mg/dL — ABNORMAL HIGH (ref 0.60–1.30)
Glucose: 87 mg/dL (ref 65–99)
SGOT(AST): 22 U/L (ref 15–37)
SGPT (ALT): 25 U/L (ref 12–78)
Total Protein: 7.2 g/dL (ref 6.4–8.2)

## 2013-08-15 LAB — CBC CANCER CENTER
Basophil #: 0.1 x10 3/mm (ref 0.0–0.1)
Eosinophil #: 0.2 x10 3/mm (ref 0.0–0.7)
Lymphocyte #: 0.6 x10 3/mm — ABNORMAL LOW (ref 1.0–3.6)
Monocyte %: 9.2 %
Neutrophil %: 78.7 %
RBC: 4.53 10*6/uL (ref 3.80–5.20)
WBC: 7 x10 3/mm (ref 3.6–11.0)

## 2013-09-08 ENCOUNTER — Ambulatory Visit: Payer: Self-pay | Admitting: Oncology

## 2013-09-12 LAB — COMPREHENSIVE METABOLIC PANEL
ALBUMIN: 3.1 g/dL — AB (ref 3.4–5.0)
ALK PHOS: 98 U/L
Anion Gap: 6 — ABNORMAL LOW (ref 7–16)
BILIRUBIN TOTAL: 0.3 mg/dL (ref 0.2–1.0)
BUN: 29 mg/dL — ABNORMAL HIGH (ref 7–18)
CALCIUM: 9.2 mg/dL (ref 8.5–10.1)
Chloride: 100 mmol/L (ref 98–107)
Co2: 33 mmol/L — ABNORMAL HIGH (ref 21–32)
Creatinine: 1.58 mg/dL — ABNORMAL HIGH (ref 0.60–1.30)
EGFR (African American): 38 — ABNORMAL LOW
EGFR (Non-African Amer.): 33 — ABNORMAL LOW
Glucose: 117 mg/dL — ABNORMAL HIGH (ref 65–99)
Osmolality: 284 (ref 275–301)
Potassium: 3.7 mmol/L (ref 3.5–5.1)
SGOT(AST): 27 U/L (ref 15–37)
SGPT (ALT): 30 U/L (ref 12–78)
Sodium: 139 mmol/L (ref 136–145)
Total Protein: 7.3 g/dL (ref 6.4–8.2)

## 2013-09-12 LAB — CBC CANCER CENTER
BASOS ABS: 0.1 x10 3/mm (ref 0.0–0.1)
Basophil %: 0.7 %
EOS ABS: 0.1 x10 3/mm (ref 0.0–0.7)
Eosinophil %: 0.8 %
HCT: 37 % (ref 35.0–47.0)
HGB: 11.6 g/dL — ABNORMAL LOW (ref 12.0–16.0)
LYMPHS ABS: 0.6 x10 3/mm — AB (ref 1.0–3.6)
Lymphocyte %: 4.4 %
MCH: 25.7 pg — ABNORMAL LOW (ref 26.0–34.0)
MCHC: 31.3 g/dL — ABNORMAL LOW (ref 32.0–36.0)
MCV: 82 fL (ref 80–100)
Monocyte #: 0.9 x10 3/mm (ref 0.2–0.9)
Monocyte %: 6.8 %
NEUTROS ABS: 11.3 x10 3/mm — AB (ref 1.4–6.5)
NEUTROS PCT: 87.3 %
Platelet: 254 x10 3/mm (ref 150–440)
RBC: 4.51 10*6/uL (ref 3.80–5.20)
RDW: 16.7 % — AB (ref 11.5–14.5)
WBC: 13 x10 3/mm — AB (ref 3.6–11.0)

## 2013-10-06 LAB — COMPREHENSIVE METABOLIC PANEL
ALT: 23 U/L (ref 12–78)
ANION GAP: 7 (ref 7–16)
AST: 24 U/L (ref 15–37)
Albumin: 3.4 g/dL (ref 3.4–5.0)
Alkaline Phosphatase: 101 U/L
BUN: 19 mg/dL — ABNORMAL HIGH (ref 7–18)
Bilirubin,Total: 0.4 mg/dL (ref 0.2–1.0)
CALCIUM: 12 mg/dL — AB (ref 8.5–10.1)
CO2: 30 mmol/L (ref 21–32)
Chloride: 104 mmol/L (ref 98–107)
Creatinine: 1.29 mg/dL (ref 0.60–1.30)
EGFR (African American): 48 — ABNORMAL LOW
GFR CALC NON AF AMER: 42 — AB
GLUCOSE: 89 mg/dL (ref 65–99)
Osmolality: 283 (ref 275–301)
POTASSIUM: 3.8 mmol/L (ref 3.5–5.1)
Sodium: 141 mmol/L (ref 136–145)
TOTAL PROTEIN: 7.3 g/dL (ref 6.4–8.2)

## 2013-10-06 LAB — CBC CANCER CENTER
Basophil #: 0.1 x10 3/mm (ref 0.0–0.1)
Basophil %: 1.6 %
EOS ABS: 0.8 x10 3/mm — AB (ref 0.0–0.7)
Eosinophil %: 12 %
HCT: 36 % (ref 35.0–47.0)
HGB: 11.4 g/dL — ABNORMAL LOW (ref 12.0–16.0)
Lymphocyte #: 0.9 x10 3/mm — ABNORMAL LOW (ref 1.0–3.6)
Lymphocyte %: 13 %
MCH: 25.7 pg — ABNORMAL LOW (ref 26.0–34.0)
MCHC: 31.7 g/dL — ABNORMAL LOW (ref 32.0–36.0)
MCV: 81 fL (ref 80–100)
Monocyte #: 1 x10 3/mm — ABNORMAL HIGH (ref 0.2–0.9)
Monocyte %: 15.2 %
NEUTROS ABS: 3.8 x10 3/mm (ref 1.4–6.5)
Neutrophil %: 58.2 %
Platelet: 240 x10 3/mm (ref 150–440)
RBC: 4.45 10*6/uL (ref 3.80–5.20)
RDW: 16.8 % — AB (ref 11.5–14.5)
WBC: 6.6 x10 3/mm (ref 3.6–11.0)

## 2013-10-09 ENCOUNTER — Ambulatory Visit: Payer: Self-pay | Admitting: Oncology

## 2013-11-06 ENCOUNTER — Ambulatory Visit: Payer: Self-pay | Admitting: Oncology

## 2013-11-07 LAB — COMPREHENSIVE METABOLIC PANEL
ALBUMIN: 3.5 g/dL (ref 3.4–5.0)
ALT: 14 U/L (ref 12–78)
ANION GAP: 11 (ref 7–16)
Alkaline Phosphatase: 93 U/L
BILIRUBIN TOTAL: 0.3 mg/dL (ref 0.2–1.0)
BUN: 21 mg/dL — ABNORMAL HIGH (ref 7–18)
CALCIUM: 9.2 mg/dL (ref 8.5–10.1)
CHLORIDE: 103 mmol/L (ref 98–107)
CO2: 26 mmol/L (ref 21–32)
Creatinine: 1.4 mg/dL — ABNORMAL HIGH (ref 0.60–1.30)
EGFR (African American): 44 — ABNORMAL LOW
EGFR (Non-African Amer.): 38 — ABNORMAL LOW
GLUCOSE: 145 mg/dL — AB (ref 65–99)
OSMOLALITY: 285 (ref 275–301)
Potassium: 3.5 mmol/L (ref 3.5–5.1)
SGOT(AST): 17 U/L (ref 15–37)
SODIUM: 140 mmol/L (ref 136–145)
Total Protein: 7.2 g/dL (ref 6.4–8.2)

## 2013-11-07 LAB — CBC CANCER CENTER
Basophil #: 0.1 x10 3/mm (ref 0.0–0.1)
Basophil %: 0.6 %
EOS ABS: 0.7 x10 3/mm (ref 0.0–0.7)
Eosinophil %: 7.7 %
HCT: 38 % (ref 35.0–47.0)
HGB: 11.8 g/dL — ABNORMAL LOW (ref 12.0–16.0)
LYMPHS PCT: 10.6 %
Lymphocyte #: 0.9 x10 3/mm — ABNORMAL LOW (ref 1.0–3.6)
MCH: 25.3 pg — ABNORMAL LOW (ref 26.0–34.0)
MCHC: 31 g/dL — ABNORMAL LOW (ref 32.0–36.0)
MCV: 82 fL (ref 80–100)
Monocyte #: 0.9 x10 3/mm (ref 0.2–0.9)
Monocyte %: 9.6 %
NEUTROS PCT: 71.5 %
Neutrophil #: 6.4 x10 3/mm (ref 1.4–6.5)
PLATELETS: 246 x10 3/mm (ref 150–440)
RBC: 4.66 10*6/uL (ref 3.80–5.20)
RDW: 17.2 % — AB (ref 11.5–14.5)
WBC: 8.9 x10 3/mm (ref 3.6–11.0)

## 2013-12-05 LAB — CBC CANCER CENTER
Basophil #: 0.1 x10 3/mm (ref 0.0–0.1)
Basophil %: 0.7 %
EOS ABS: 0.6 x10 3/mm (ref 0.0–0.7)
EOS PCT: 6 %
HCT: 39.4 % (ref 35.0–47.0)
HGB: 12.3 g/dL (ref 12.0–16.0)
LYMPHS PCT: 7.9 %
Lymphocyte #: 0.8 x10 3/mm — ABNORMAL LOW (ref 1.0–3.6)
MCH: 25.4 pg — ABNORMAL LOW (ref 26.0–34.0)
MCHC: 31.4 g/dL — AB (ref 32.0–36.0)
MCV: 81 fL (ref 80–100)
MONO ABS: 0.9 x10 3/mm (ref 0.2–0.9)
Monocyte %: 8.8 %
NEUTROS PCT: 76.6 %
Neutrophil #: 8.2 x10 3/mm — ABNORMAL HIGH (ref 1.4–6.5)
Platelet: 223 x10 3/mm (ref 150–440)
RBC: 4.87 10*6/uL (ref 3.80–5.20)
RDW: 17.4 % — AB (ref 11.5–14.5)
WBC: 10.7 x10 3/mm (ref 3.6–11.0)

## 2013-12-05 LAB — COMPREHENSIVE METABOLIC PANEL
ANION GAP: 11 (ref 7–16)
Albumin: 3.7 g/dL (ref 3.4–5.0)
Alkaline Phosphatase: 91 U/L
BUN: 33 mg/dL — ABNORMAL HIGH (ref 7–18)
Bilirubin,Total: 0.5 mg/dL (ref 0.2–1.0)
CALCIUM: 9.6 mg/dL (ref 8.5–10.1)
CHLORIDE: 101 mmol/L (ref 98–107)
CO2: 28 mmol/L (ref 21–32)
Creatinine: 1.59 mg/dL — ABNORMAL HIGH (ref 0.60–1.30)
GFR CALC AF AMER: 37 — AB
GFR CALC NON AF AMER: 32 — AB
Glucose: 115 mg/dL — ABNORMAL HIGH (ref 65–99)
OSMOLALITY: 288 (ref 275–301)
Potassium: 3.4 mmol/L — ABNORMAL LOW (ref 3.5–5.1)
SGOT(AST): 18 U/L (ref 15–37)
SGPT (ALT): 17 U/L (ref 12–78)
SODIUM: 140 mmol/L (ref 136–145)
Total Protein: 7.5 g/dL (ref 6.4–8.2)

## 2013-12-07 ENCOUNTER — Ambulatory Visit: Payer: Self-pay | Admitting: Oncology

## 2014-01-02 LAB — CBC CANCER CENTER
BASOS ABS: 0.1 x10 3/mm (ref 0.0–0.1)
Basophil %: 1.1 %
EOS ABS: 0.6 x10 3/mm (ref 0.0–0.7)
Eosinophil %: 5.1 %
HCT: 38.9 % (ref 35.0–47.0)
HGB: 12.3 g/dL (ref 12.0–16.0)
LYMPHS ABS: 0.9 x10 3/mm — AB (ref 1.0–3.6)
LYMPHS PCT: 8.3 %
MCH: 25.1 pg — ABNORMAL LOW (ref 26.0–34.0)
MCHC: 31.7 g/dL — ABNORMAL LOW (ref 32.0–36.0)
MCV: 79 fL — AB (ref 80–100)
MONOS PCT: 10.2 %
Monocyte #: 1.1 x10 3/mm — ABNORMAL HIGH (ref 0.2–0.9)
NEUTROS PCT: 75.3 %
Neutrophil #: 8.2 x10 3/mm — ABNORMAL HIGH (ref 1.4–6.5)
Platelet: 244 x10 3/mm (ref 150–440)
RBC: 4.91 10*6/uL (ref 3.80–5.20)
RDW: 16.4 % — ABNORMAL HIGH (ref 11.5–14.5)
WBC: 10.9 x10 3/mm (ref 3.6–11.0)

## 2014-01-02 LAB — COMPREHENSIVE METABOLIC PANEL
Albumin: 3.4 g/dL (ref 3.4–5.0)
Alkaline Phosphatase: 86 U/L
Anion Gap: 11 (ref 7–16)
BILIRUBIN TOTAL: 0.4 mg/dL (ref 0.2–1.0)
BUN: 26 mg/dL — ABNORMAL HIGH (ref 7–18)
CO2: 27 mmol/L (ref 21–32)
Calcium, Total: 10.9 mg/dL — ABNORMAL HIGH (ref 8.5–10.1)
Chloride: 105 mmol/L (ref 98–107)
Creatinine: 1.57 mg/dL — ABNORMAL HIGH (ref 0.60–1.30)
EGFR (African American): 38 — ABNORMAL LOW
EGFR (Non-African Amer.): 33 — ABNORMAL LOW
Glucose: 107 mg/dL — ABNORMAL HIGH (ref 65–99)
OSMOLALITY: 290 (ref 275–301)
Potassium: 4.1 mmol/L (ref 3.5–5.1)
SGOT(AST): 14 U/L — ABNORMAL LOW (ref 15–37)
SGPT (ALT): 13 U/L (ref 12–78)
Sodium: 143 mmol/L (ref 136–145)
Total Protein: 7.2 g/dL (ref 6.4–8.2)

## 2014-01-06 ENCOUNTER — Ambulatory Visit: Payer: Self-pay | Admitting: Oncology

## 2014-01-31 ENCOUNTER — Ambulatory Visit: Payer: Self-pay | Admitting: Oncology

## 2014-01-31 LAB — COMPREHENSIVE METABOLIC PANEL
ALK PHOS: 86 U/L
Albumin: 3.6 g/dL (ref 3.4–5.0)
Anion Gap: 9 (ref 7–16)
BUN: 22 mg/dL — ABNORMAL HIGH (ref 7–18)
Bilirubin,Total: 0.3 mg/dL (ref 0.2–1.0)
Calcium, Total: 9.7 mg/dL (ref 8.5–10.1)
Chloride: 100 mmol/L (ref 98–107)
Co2: 32 mmol/L (ref 21–32)
Creatinine: 1.41 mg/dL — ABNORMAL HIGH (ref 0.60–1.30)
EGFR (African American): 43 — ABNORMAL LOW
EGFR (Non-African Amer.): 37 — ABNORMAL LOW
Glucose: 122 mg/dL — ABNORMAL HIGH (ref 65–99)
Osmolality: 286 (ref 275–301)
Potassium: 4.1 mmol/L (ref 3.5–5.1)
SGOT(AST): 11 U/L — ABNORMAL LOW (ref 15–37)
SGPT (ALT): 14 U/L (ref 12–78)
SODIUM: 141 mmol/L (ref 136–145)
Total Protein: 7.7 g/dL (ref 6.4–8.2)

## 2014-01-31 LAB — CBC CANCER CENTER
BASOS ABS: 0.1 x10 3/mm (ref 0.0–0.1)
Basophil %: 0.5 %
EOS ABS: 0.1 x10 3/mm (ref 0.0–0.7)
Eosinophil %: 0.5 %
HCT: 40.2 % (ref 35.0–47.0)
HGB: 12.7 g/dL (ref 12.0–16.0)
Lymphocyte #: 0.6 x10 3/mm — ABNORMAL LOW (ref 1.0–3.6)
Lymphocyte %: 4.7 %
MCH: 25.3 pg — ABNORMAL LOW (ref 26.0–34.0)
MCHC: 31.5 g/dL — ABNORMAL LOW (ref 32.0–36.0)
MCV: 80 fL (ref 80–100)
MONO ABS: 0.7 x10 3/mm (ref 0.2–0.9)
MONOS PCT: 5.8 %
Neutrophil #: 11.3 x10 3/mm — ABNORMAL HIGH (ref 1.4–6.5)
Neutrophil %: 88.5 %
Platelet: 269 x10 3/mm (ref 150–440)
RBC: 5.01 10*6/uL (ref 3.80–5.20)
RDW: 16.9 % — ABNORMAL HIGH (ref 11.5–14.5)
WBC: 12.8 x10 3/mm — ABNORMAL HIGH (ref 3.6–11.0)

## 2014-02-02 ENCOUNTER — Ambulatory Visit: Payer: Self-pay | Admitting: Oncology

## 2014-02-06 ENCOUNTER — Ambulatory Visit: Payer: Self-pay | Admitting: Oncology

## 2014-02-09 ENCOUNTER — Ambulatory Visit: Payer: Self-pay | Admitting: Oncology

## 2014-02-14 LAB — CBC CANCER CENTER
BASOS ABS: 0.2 x10 3/mm — AB (ref 0.0–0.1)
Basophil %: 1.2 %
EOS ABS: 0.8 x10 3/mm — AB (ref 0.0–0.7)
Eosinophil %: 4.5 %
HCT: 38.9 % (ref 35.0–47.0)
HGB: 12.2 g/dL (ref 12.0–16.0)
Lymphocyte #: 2 x10 3/mm (ref 1.0–3.6)
Lymphocyte %: 11.9 %
MCH: 25 pg — ABNORMAL LOW (ref 26.0–34.0)
MCHC: 31.4 g/dL — ABNORMAL LOW (ref 32.0–36.0)
MCV: 80 fL (ref 80–100)
Monocyte #: 1.9 x10 3/mm — ABNORMAL HIGH (ref 0.2–0.9)
Monocyte %: 11.2 %
Neutrophil #: 12.1 x10 3/mm — ABNORMAL HIGH (ref 1.4–6.5)
Neutrophil %: 71.2 %
PLATELETS: 248 x10 3/mm (ref 150–440)
RBC: 4.9 10*6/uL (ref 3.80–5.20)
RDW: 17 % — AB (ref 11.5–14.5)
WBC: 17 x10 3/mm — ABNORMAL HIGH (ref 3.6–11.0)

## 2014-03-06 IMAGING — CT CT HEAD WITHOUT CONTRAST
2 series · 15 of 30 positions shown, 19 images · non-contrast
Comparison: none

REASON FOR EXAM: hx of benign brain rumor - removed one year ago - now
with pain, extensive, in L
COMMENTS:

PROCEDURE:     CT  - CT HEAD WITHOUT CONTRAST  - January 04, 2012  [DATE]
RESULT:     Comparison:  None
TECHNIQUE: Multiple axial images from the foramen magnum to the vertex were
obtained without IV contrast.

[Series 2: without · axial · non-contrast · 0.42mm/px · z∈[-55,+65]mm · 13 of 30 slices shown, 17 images]
[im 3/30  brain]
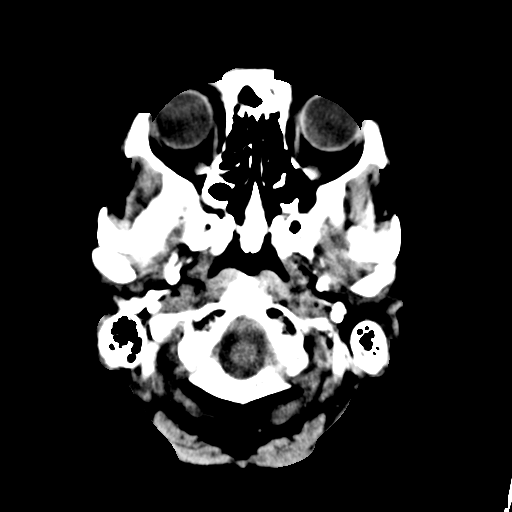
[im 3/30  bone]
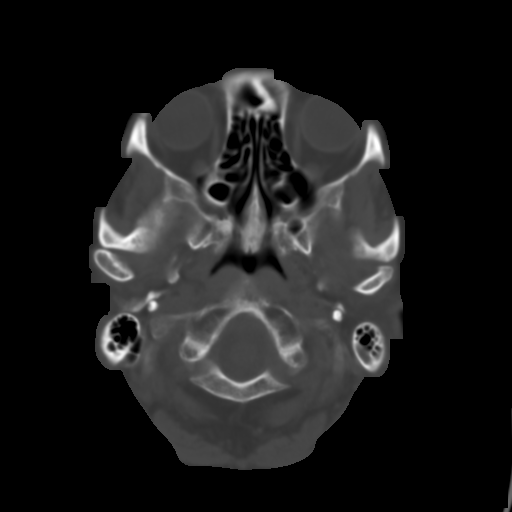
[im 5/30  brain]
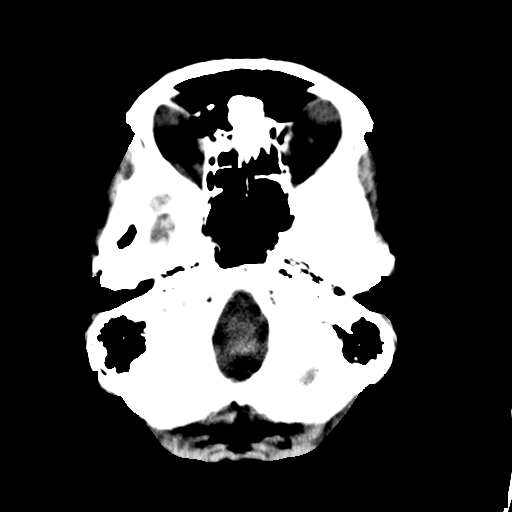
[im 7/30  brain]
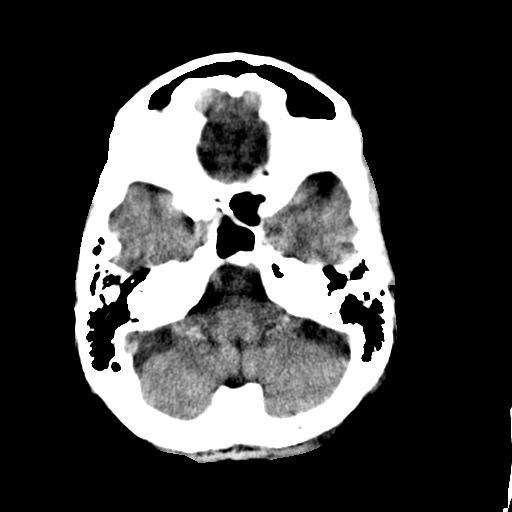
[im 9/30  brain]
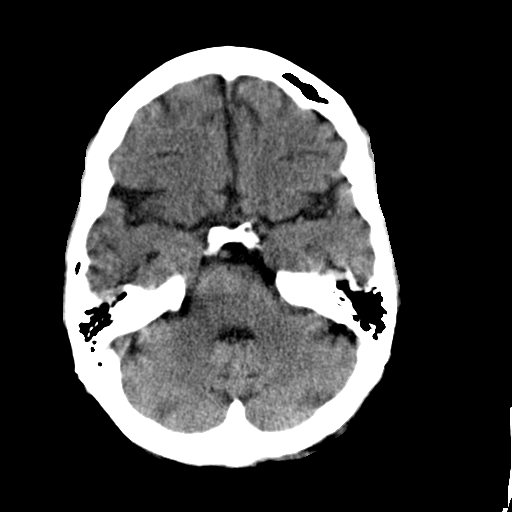
[im 11/30  brain]
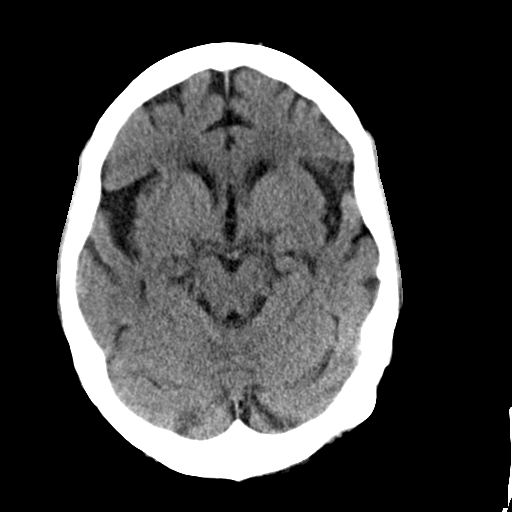
[im 11/30  bone]
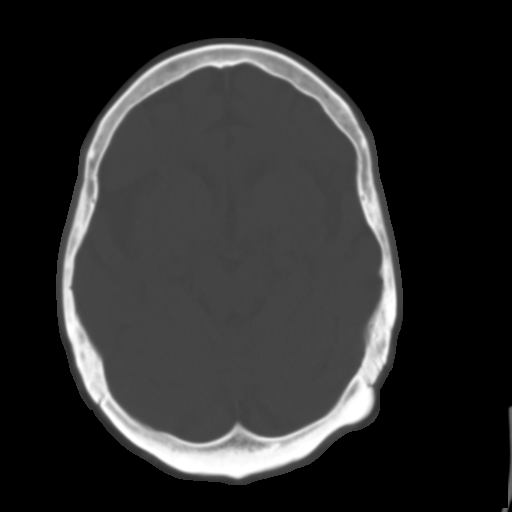
[im 13/30  brain]
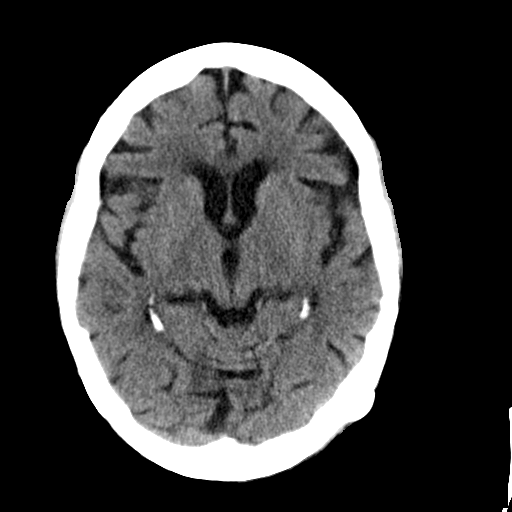
[im 15/30  brain]
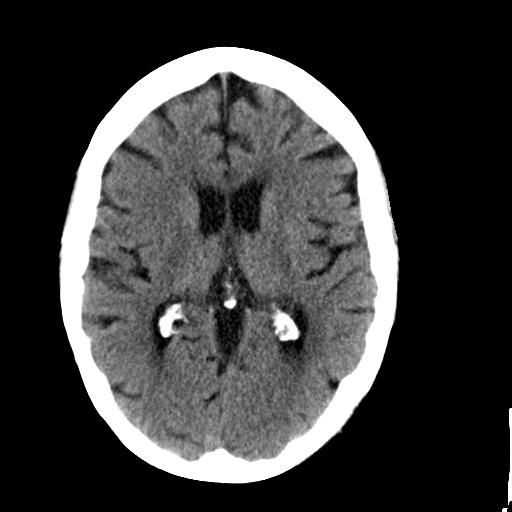
[im 17/30  brain]
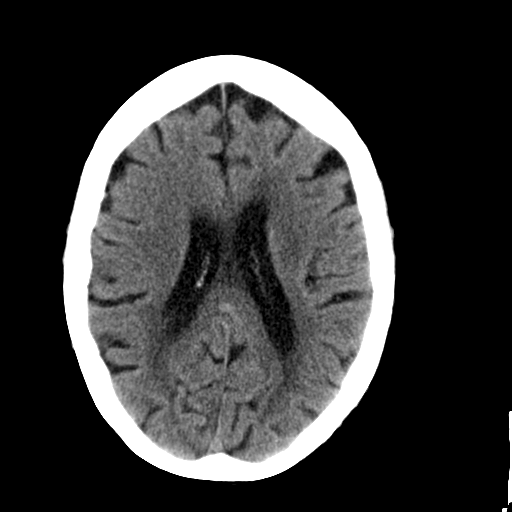
[im 19/30  brain]
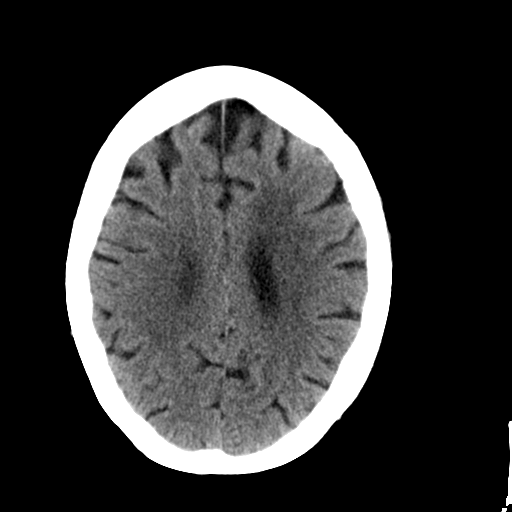
[im 19/30  bone]
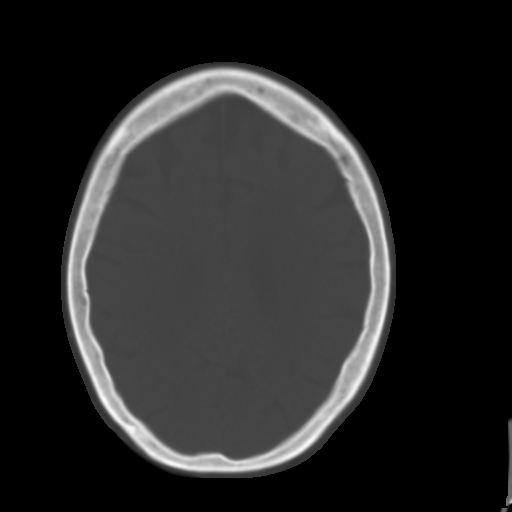
[im 21/30  brain]
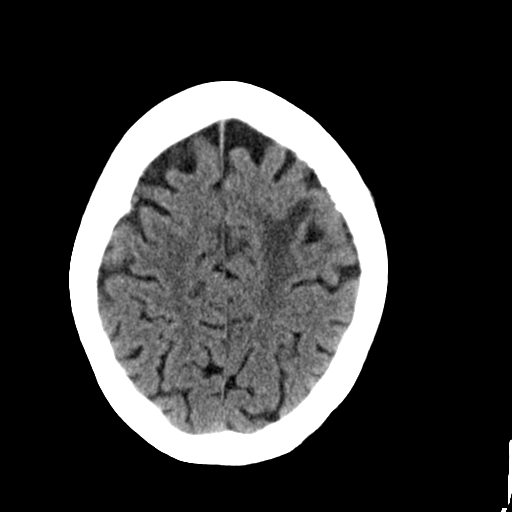
[im 23/30  brain]
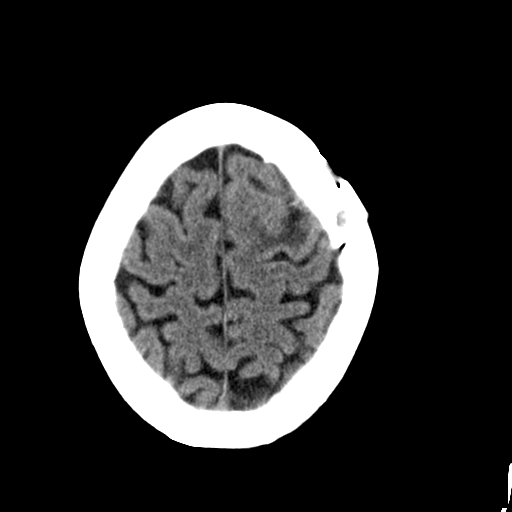
[im 25/30  brain]
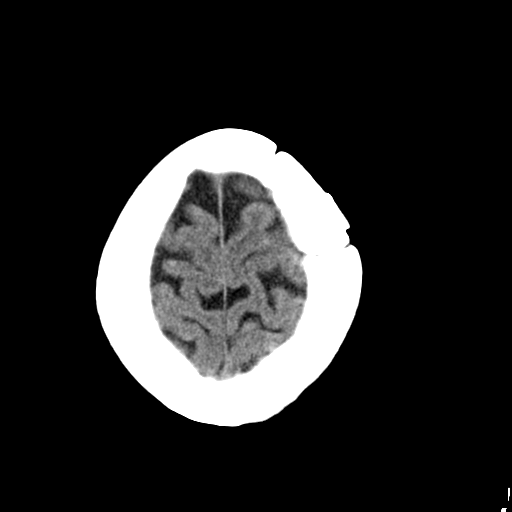
[im 27/30  brain]
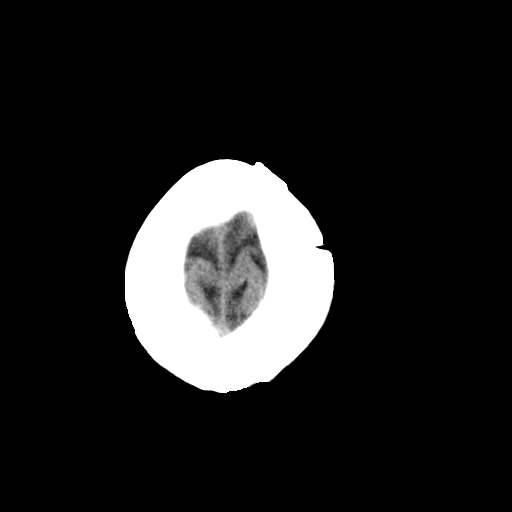
[im 27/30  bone]
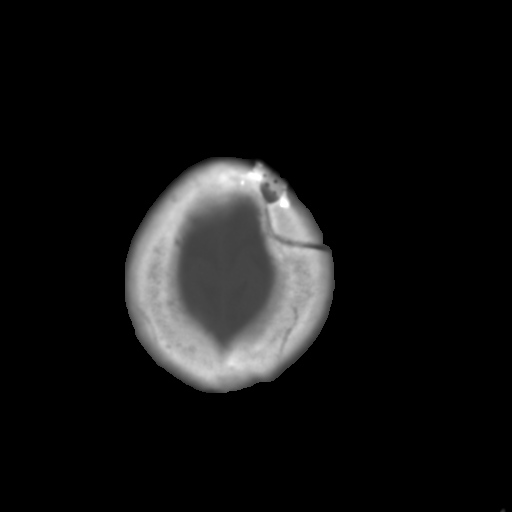

[Series 3: bone · axial · 0.42mm/px · z∈[-55,-35]mm · 2 of 30 slices shown]
[im 3/30  bone]
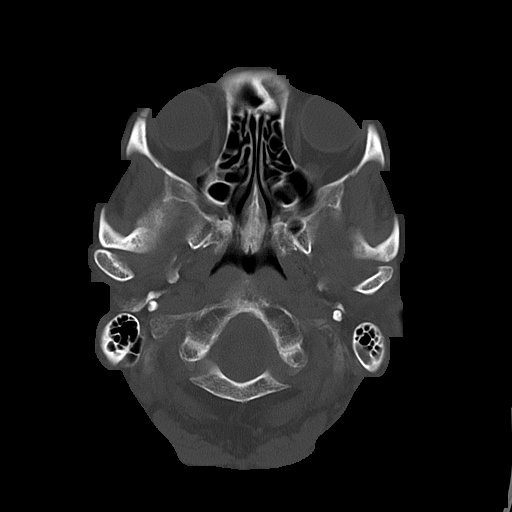
[im 7/30  bone]
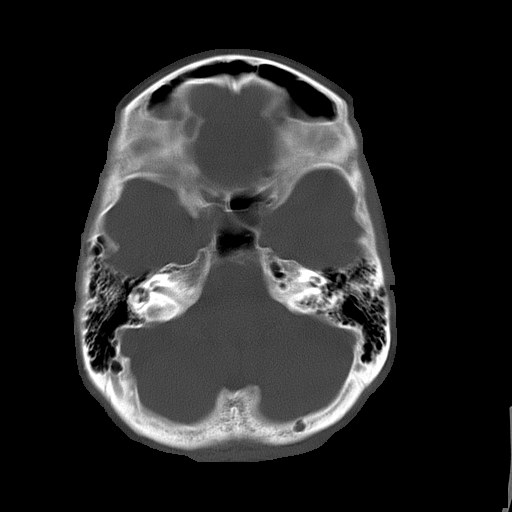

[15 of 30 positions shown; findings below may reference images not displayed]

FINDINGS: There is no evidence of mass effect, midline shift, or extra-axial fluid
collections.  There is no evidence of a space-occupying lesion or
intracranial hemorrhage. There is left frontal lobe encephalomalacia from
prior surgery. There is no evidence of a cortical-based area of acute
infarction. There is generalized cerebral atrophy. There is periventricular
white matter low attenuation likely secondary to microangiopathy.

The ventricles and sulci are appropriate for the patient's age. The basal
cisterns are patent.

Visualized portions of the orbits are unremarkable. The visualized portions
of the paranasal sinuses and mastoid air cells are unremarkable.

There is a left frontal craniotomy.
IMPRESSION: No acute intracranial process.

[REDACTED]

## 2014-03-08 ENCOUNTER — Ambulatory Visit: Payer: Self-pay | Admitting: Oncology

## 2014-04-08 DEATH — deceased

## 2014-12-31 NOTE — Discharge Summary (Signed)
PATIENT NAME:  Kelsey Walter, COLASURDO MR#:  [id number] DATE OF BIRTH:  08-02-1941  DATE OF ADMISSION:  01/04/2012 DATE OF DISCHARGE:  01/07/2012  DISCHARGE DIAGNOSES:  1. Herpes zoster shingles in immunocompromised patient in C2 dermatomal distribution. 2. History of metastatic lung cancer.  3. History of breast cancer.  4. Hypothyroidism.  5. Gastroesophageal reflux disease. 6. 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  7. Osteoporosis.   CONSULTATIONS: Infectious disease, Dr. Leavy Cella.   HOSPITAL COURSE: This is a 74 year old female who follows with Dr. Doylene Canning. She has history of lung cancer with previous brain metastasis, also history of breast cancer status post mastectomy, osteoporosis, and gastroesophageal reflux disease. She presented with severe rash involving the scalp area extending down to her left ear and then to the left mandibular area. The patient presented with severe pain and rash with some vesicles on it. The patient was admitted as herpes zoster versus cellulitis, but clinically it looked like herpes zoster which was confirmed by infectious disease also. The patient has exquisite pain in the C2 dermatomal distribution. The patient was started on IV acyclovir because she was immunocompromised when she came in. She also had a CT of the head done at the time of admission that was essentially negative. Her white count when she came in was 3.4. She has chronic thrombocytopenia and some pancytopenia and her platelet count was in the range of 100,000. Her creatinine was normal at 0.8. LFTs were normal. A varicella zoster culture was sent. They did not isolate any virus  at 24 hours, but they are going to repeat it to follow after seven days, but clinically, this is herpes zoster. She was started on Percocet p.r.n. and IV Dilaudid p.r.n. for pain management. Her pain is better controlled now. She says Dilaudid helps her, but not the Percocet. I am going to discharge her on Dilaudid p.o. for pain  management for herpes zoster along with Neurontin three times a day for her neuropathic pain. Her creatinine remained stable at 0.65. She also complained of some nausea, vomiting and retching during the hospital stay. She was given IV PPI. She was given IV fluids. Her nausea and vomiting has resolved. She is tolerating diet. Her blood cultures remained negative. Her Ancef, which was started at the time of admission, was discontinued by ID because this is not bacterial cellulitis and no secondary infection. ID suggested to treat a total of seven days with antivirals, so she got three days of acyclovir here and I will discharge her on four days of valacyclovir. The patient has a lot of crusting and eschar right now, so the lesions are crusting but some have vesicles in it, so advised her to stay at home with minimal contact. She was  maintained on airborne isolation during the hospital stay. I also advised the patient and the family that the patient should not be taking Vicodin and Dilaudid together, because that can cause drowsiness.   DISCHARGE MEDICATIONS:  Home medications: 1. Prednisone 10 mg daily.  2. Ambien 10 mg at bedtime as needed. 3. Levothyroxine 50 mcg daily.  4. Folic acid 1 mg daily.  5. Lactulose 4 times a day as needed for constipation. 6. Celexa 20 mg daily.  7. Aleve q.8 hours as needed.  8. Multivitamin daily.  9. Calcium once a day. 10. Promethazine 25 mg q.6 hours as needed.  11. Vitamin B12 500 mcg daily.  12. Vicodin every four hours as needed for pain.  New medications:  1.  Dilaudid 2 mg p.o. every 4 to 6 hours as needed for severe pain.  2. Neurontin 100 mg p.o. t.i.d.  3. Valtrex 1000 mg p.o. q.12 hours for four days.   NOTE: Do not take Vicodin( hydrocodone) if taking Dilaudid for pain.   CONDITION AT DISCHARGE: She is comfortable. T-max 98.8, heart rate 70, blood pressure 105/63 to 101/64, saturating 94% on room air. Chest is clear. Heart sounds are regular.  Abdomen soft, nontender. Well-hydrated. She has a rash involving the back of the scalp, left pinna and the left mandibular area with crusting. There are some minimal vesicles left.   DIET: Regular.   FOLLOW-UP:  1. Follow up with Dr. Doylene Canninghoksi in one week.  2. Follow up with Dr. Randa LynnLamb in 1 to 2 weeks if needed.  ACTIVITY: I advised she should stay at home with minimal contact considering that she still has active lesions.   I also updated Dr. Consuela MimesNaik covering for Dr. Doylene Canninghoksi at the time of discharge.   TIME SPENT ON DISCHARGE: 45 minutes.   ____________________________ Fredia SorrowAbhinav Jasun Gasparini, MD ag:ap D: 01/07/2012 12:45:51 ET T: 01/08/2012 14:25:35 ET JOB#: [id number]  cc: Fredia SorrowAbhinav Javione Gunawan, MD, <Dictator> Gerome SamJanak K. Doylene Canninghoksi, MD Reola MosherAndrew S. Randa LynnLamb, MD Fredia SorrowABHINAV Jecenia Leamer MD ELECTRONICALLY SIGNED 01/14/2012 12:09

## 2014-12-31 NOTE — H&P (Signed)
PATIENT NAME:  Kelsey Walter, Kelsey Walter MR#:  [id number] DATE OF BIRTH:  05-30-1941  DATE OF ADMISSION:  01/04/2012  REFERRING PHYSICIAN: Dr. Thomasene Lot PRIMARY CARE PHYSICIAN: Dr. Arline Asp  PRIMARY ONCOLOGIST: Dr. Oliva Bustard  CHIEF COMPLAINT: Scalp pain and rash.   HISTORY OF PRESENT ILLNESS: Patient is a 74 year old Caucasian female with history of breast cancer status post mastectomy and lung cancer diagnosed in 2009 status post METs to bone as well as brain status post resection of the lesion in the brain followed by radiation who has finished chemotherapy and radiation as of last year, osteoporosis, seizure disorder on Keppra who presents with a week history of left scalp tenderness with some radiation to the neck area, headaches and then a rash for two days which appears to be progressing. The rash is mostly on the left side of the head. Initially the rash started about the jaw line on the left and then she felt tenderness which was exquisite with radiation, redness, and development of some vesicles as well. One of the vesicles popped today. She had called Dr. Oliva Bustard a couple of days ago and was given Vicodin, however, it did not help much. She has had no fevers, chills, or sick contacts. She did go to the mountains on Friday with her family, however, her symptoms had preceded the trip. There is no trauma. There is no recent antibiotics. On arrival she was not febrile. She was given IV antibiotics for presumed cellulitis. Hospitalist services were contacted for further evaluation and management.   PAST MEDICAL HISTORY: 1. Lung cancer diagnosed in 2009 with METs to brain, which has been resected, seizures in the setting of the brain mass which has not recurred after resection of the brain mass. 2. Gastroesophageal reflux disease.  3. Osteoporosis.  4. History of breast cancer status post mastectomy. 5. Craniotomy. 6. Hysterectomy.  7. History of pancytopenia in the setting of chemotherapy agent.  8. History of  diverticulitis with the complication of perf.   CURRENT MEDICATIONS:  1. Levothyroxine 50 mcg daily.  2. Lactulose p.r.n.  3. Promethazine as needed. 4. Ambien 5 mg at bedtime.  5. Prevacid 1 tab daily.  6. Prednisone 10 mg daily.  7. Folic acid 1 mg daily.  8. Vitamin B12 daily.  9. Celexa 20 mg daily.   ALLERGIES: No known drug allergies.   FAMILY HISTORY: Father with multiple myeloma. Brother with amyloidosis and sister with breast cancer.   SOCIAL HISTORY: Ex-smoker, quit 12 years ago. No alcohol on a regular basis, however, she drinks occasionally. No drug use.   REVIEW OF SYSTEMS: CONSTITUTIONAL: Denies fever, fatigue, weakness. Pain in left side of the head as above. No weight changes. EYES: No blurry vision, double vision. ENT: Has chronic bilateral hearing loss. No snoring, sore throat. RESPIRATORY: Chronic shortness of breath. CARDIOVASCULAR: No chest pains, orthopnea or edema. GASTROINTESTINAL: No nausea, vomiting, diarrhea, or melena. GENITOURINARY: Denies dysuria, hematuria, incontinence. ENDOCRINE: Denies polyuria, nocturia. Has hypothyroid state. HEME/LYMPH: No anemia or easy bruising. SKIN: Rash as above. MUSCULOSKELETAL: Some chronic arthritis. NEUROLOGIC: No numbness, weakness status post resection of a brain tumor. PSYCH: Has insomnia.   PHYSICAL EXAMINATION:  VITAL SIGNS: Temperature on arrival 96.4, current pulse 60, respiratory rate 20, blood pressure 109/65, oxygen saturation 98% on room air.   GENERAL: Patient is a pleasant thin Caucasian female laying in bed, no obvious distress.   HEENT: Pupils are equal and reactive. Anicteric sclerae. Moist mucous membranes. There is a rash with some vesicles which are subcentimeter,  multiple, on the left side of the scalp posteriorly and anteriorly, some area of rash about 2 to 3 cm above the jaw line on the left as well with some vesicles as well. There is another smaller area of perhaps 1.5 cm in the neck. Most of the rash  appears to be in C2, possibly C3.   NECK: Supple. No thyroid tenderness. No lymphadenopathy.   CARDIOVASCULAR: S1, S2 regular rhythm. No murmurs, rubs, or gallops.   LUNGS: Clear to auscultation without wheezing or rhonchi. There is a right Chemo-Port.   ABDOMEN: Soft, nontender, nondistended. Hyperactive bowel sounds all quadrants.   EXTREMITIES: No significant edema.   NEUROLOGIC: Cranial nerves II through XII grossly intact. Strength 5/5 all extremities. Sensation intact to light touch.   PSYCH: Awake, alert, oriented x3, pleasant, cooperative.   LABORATORY, DIAGNOSTIC AND RADIOLOGICAL DATA: BUN 25, creatinine 0.83, sodium 142, potassium 3.7, total protein 6.2, albumin 3.3, WBC 3.4, hemoglobin 11.7, platelets 100. CT of the head without contrast showing no evidence of space-occupying lesion or intracranial hemorrhage. There is a left frontal lobe encephalomalacia from prior surgery. No evidence of cortical based area of acute infarction. No acute intracranial process overall.   ASSESSMENT AND PLAN: We have a 74 year old Caucasian female with history of lungs cancer with METs to brain and bone, history of breast cancer status post mastectomy on the right about 15 years ago, osteoporosis who is immunocompromised being on chronic prednisone and previous seizures in setting of brain METs that has resolved post resection presents with progressive rash in the scalp area, mostly appears to be C2 with some vesicular component. There is possibly some dermatomal elements to this mostly being in C2, however, some of the rash component is closer to C3. At this point, given that she is immunocompromised with exquisite pain and some open vesicles I would cover the patient for bacterial rash as well as shingles as she has had not her shingles vaccination. Blood cultures have been sent and patient has received Ancef IV x1 which I would continue. I would place the patient on isolation and start the patient on  acyclovir. I have ordered cultures and a PCR for shingles. I would get infectious disease consult in the morning and if it is deemed this is not shingles isolation could be stopped as well as the acyclovir. Would start the patient on gentle IV fluids. I would follow with the cultures and consider giving the zoster shot. In regards to the lung cancer, this appears to be stable per family and patient. She had a PET scan in March which again showed the same lesions in the lung and scapula area. She has finished chemotherapy and radiation. Outpatient follow up with Dr. Oliva Bustard is recommended. Would continue the proton pump inhibitor for her PUD and resume her other outpatient medications. Patient is FULL CODE.   TOTAL TIME SPENT: 55 minutes.  ____________________________ Vivien Presto, MD sa:cms D: 01/04/2012 21:23:07 ET T: 01/05/2012 07:23:39 ET JOB#: [id number]  cc: Vivien Presto, MD, <Dictator> Vianne Bulls. Arline Asp, MD Vivien Presto MD ELECTRONICALLY SIGNED 01/13/2012 12:08

## 2014-12-31 NOTE — Consult Note (Signed)
Impression: 74yo WF w/ h/o breast and lung CA with metastases admitted with left C2 distribution of Zoster.  Agree with acyclovir for therapy.  This may shorten the course of her rash and decrease the risk of post-herpetic neuralgia. Will change to oral valtrex in the next day or so. Continue pain control. Continue contact and airborne isolation. No evidence for secondary infection.  Will stop the antibacterial therapy.  Electronic Signatures: Reyaan Thoma, Rosalyn GessMichael E (MD)  (Signed on 29-Apr-13 15:56)  Authored  Last Updated: 29-Apr-13 15:56 by Klynn Linnemann, Rosalyn GessMichael E (MD)

## 2014-12-31 NOTE — Consult Note (Signed)
PATIENT NAME:  Kelsey Walter, SLEZAK MR#:  [id number] DATE OF BIRTH:  September 13, 1940  DATE OF CONSULTATION:  01/05/2012  REFERRING PHYSICIAN:  Dr. Lyndel Safe  CONSULTING PHYSICIAN:  Heinz Knuckles. Langford Carias, MD  REASON FOR CONSULTATION: Possible shingles.   HISTORY OF PRESENT ILLNESS: Patient is a 74 year old white female with a past history significant for breast cancer and lung cancer status post mastectomy with metastatic disease to the bone who has received chemotherapy and radiation and who also has had a brain tumor which was reportedly benign and was treated with surgery and radiation who presented with approximately one-week history of pain in the left side of her scalp extending into her jaw. She subsequently several days after has developed an erythematous often blistering lesions over the scalp. She and her husband describe multiple small blisters over the scalp and chin, mainly the underside of the chin. She describes significant pain with even touching her hair. The pain is severe. She has never had similar episode to this in the past. She has been admitted to the hospital and is currently receiving Ancef and acyclovir. She denies any fevers, chills, or sweats. She has not had any recent injury to her scalp. She has recently been traveling to the mountains to visit family. A viral culture has been obtained and is currently pending.   ALLERGIES: None.   PAST MEDICAL HISTORY:  1. Lung cancer diagnosed in 2009 with metastatic disease.  2. Breast cancer status post mastectomy.  3. Brain mass which has been resected and patient states was benign. The history and physical indicates that the lung cancer metastasized to the brain. Patient has had seizures related to this but these have been improved since her resection and radiation. 4. Reflux.  5. Osteoporosis.  6. Diverticulitis with perforation.   FAMILY HISTORY: Positive for multiple myeloma, amyloidosis and breast cancer.   SOCIAL HISTORY: Patient lives  with her husband. She is a prior smoker. She does not drink. No history of injecting drug use.   REVIEW OF SYSTEMS: GENERAL: No fevers, chills, sweats. HEENT: She has pain over the left side of her scalp and along the underside of her jaw on the left. She has a blistering vesicular rash as well. No dysphagia. No visual changes. No sore throat. NECK: No swollen glands. No stiffness. CHEST: No cough, shortness of breath or sputum production. CARDIAC: No chest pains or palpitations. GASTROINTESTINAL: No nausea but some vomiting at times. No change in her bowels. No abdominal pain. GENITOURINARY: No change in her urine. MUSCULOSKELETAL: No complaints. SKIN: No rashes other than that described above on her scalp and left jaw. She has no bruising. NEUROLOGIC: No focal weakness. No seizures recently. PSYCHIATRIC: No complaints. All other systems are negative.   PHYSICAL EXAMINATION:  VITAL SIGNS: T-max 98.2, T-current 98.2, pulse 70, blood pressure 94/57, 98% on room air.   GENERAL: 74 year old white female uncomfortable but in no acute respiratory distress.   HEENT: Normocephalic, atraumatic. Pupils equal and reactive to light. Extraocular motion intact. Sclerae, conjunctivae, and lids are without evidence for emboli or petechiae. Oropharynx shows no erythema or exudate. Teeth and gums are in good condition.   NECK: Midline trachea. No lymphadenopathy. No thyromegaly.   CHEST: Clear to auscultation bilaterally. Good air movement. No focal consolidation.   CARDIAC: Regular rate and rhythm without murmur, rub, or gallop.   ABDOMEN: Soft, nontender, nondistended. No hepatosplenomegaly. No hernia is noted.   EXTREMITIES: No evidence for tenosynovitis.   SKIN: She had an erythematous rash  over the scalp that crossed just over the midline posteriorly. The rash was more patchy along the underside of the jaw on the left. There were two areas with some edema and vesicular formation on the jaw and scattered  areas of vesicles in the scalp. The area was extremely tender to even minimal touch along the scalp although the jawline was not as tender. She had no purulence noted in the area. There were no other rashes. There were no stigmata of endocarditis, specifically no Janeway lesions nor Osler nodes.   NEUROLOGIC: The patient was awake and interactive, moving all four extremities.   PSYCHIATRIC: Mood and affect appeared normal.   LABORATORY, DIAGNOSTIC, AND RADIOLOGICAL DATA: BUN 19, creatinine 0.74, bicarbonate 27, anion gap 6, AST 15, ALT 12, alkaline phosphatase 67, total bilirubin 0.4, white count 4.4 with a hemoglobin 11.9, platelet count 96, TSH 3.83, ANC 3.8. Blood cultures from admission show no growth.   IMPRESSION: 74 year old white female with a history of breast and lung cancer with metastases admitted with left C2 distribution of zoster.   RECOMMENDATIONS:  1. I agree with acyclovir for therapy. This may shorten the course of her rash and decrease the risk of postherpetic neuralgia.  2. Will change to oral Valtrex in the next day or so presuming she does well.   3. Would continue pain control.  4. Would continue contact and airborne isolation.  5. There is no evidence for secondary infection. Will stop the antibacterial therapy.   This is a low complex infectious disease consult. Thank you very much for involving me in [name]' care.   ____________________________ Heinz Knuckles. Yoko Mcgahee, MD meb:cms D: 01/05/2012 16:49:29 ET T: 01/06/2012 07:18:56 ET JOB#: [id number]  cc: Heinz Knuckles. Mikalah Skyles, MD, <Dictator> Shareef Eddinger E Trevon Strothers MD ELECTRONICALLY SIGNED 01/06/2012 15:02

## 2015-02-22 IMAGING — CT CT CHEST-ABD W/ CM
1 of 2 series · 14 of 29 positions shown, 19 images · non-contrast
Comparison: none

REASON FOR EXAM: Lung ca loss of weight
COMMENTS:

PROCEDURE:     CT  - CT CHEST AND ABDOMEN W  - December 22, 2012  [DATE]
RESULT:     Intraconal lung cancer.
Comparison Study: Prior CT of 03/08/2012.

[Series 2: soft tissue · axial · 0.65mm/px · z∈[-666,-308]mm · 14 of 139 slices shown, 19 images]
[im 10/139  mediastinal]
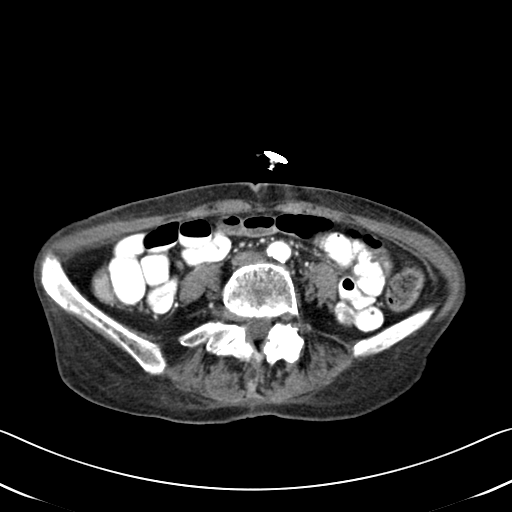
[im 10/139  bone]
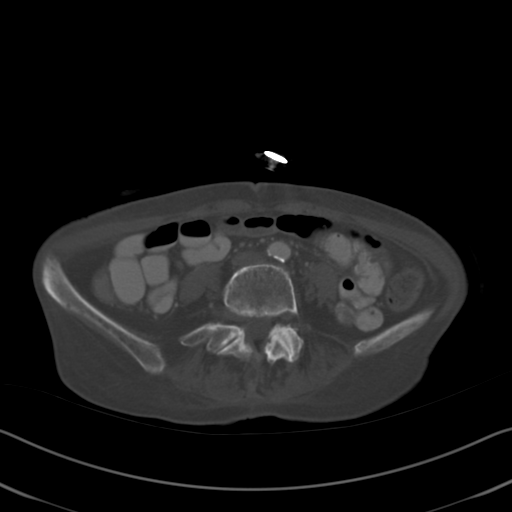
[im 19/139  mediastinal]
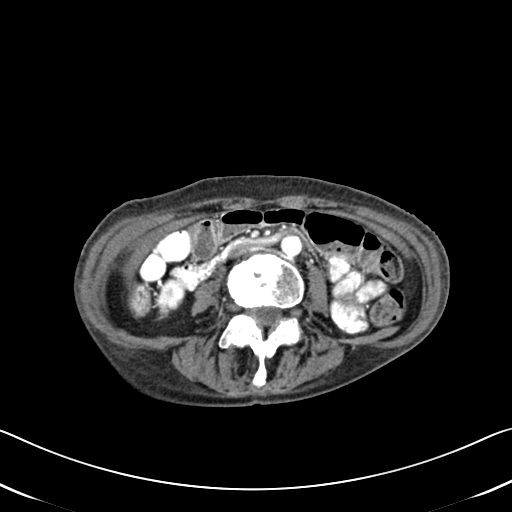
[im 28/139  mediastinal]
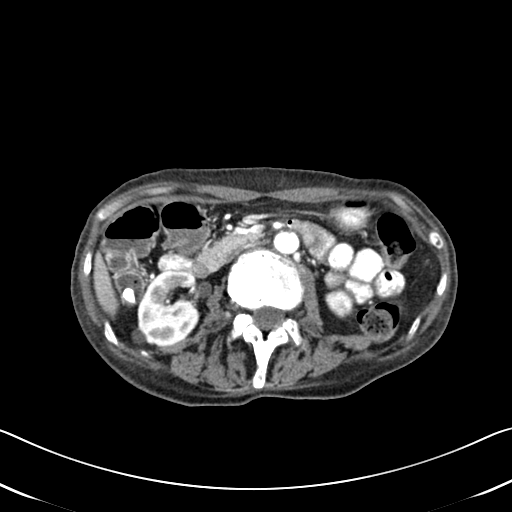
[im 47/139  mediastinal]
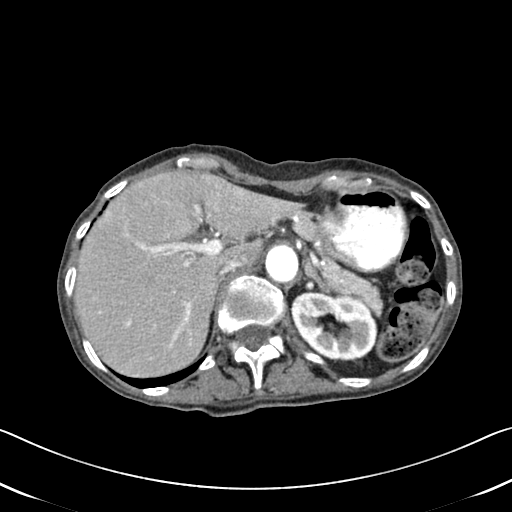
[im 56/139  mediastinal]
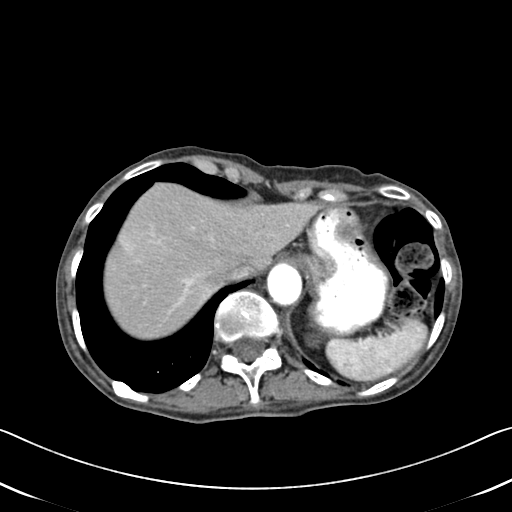
[im 65/139  mediastinal]
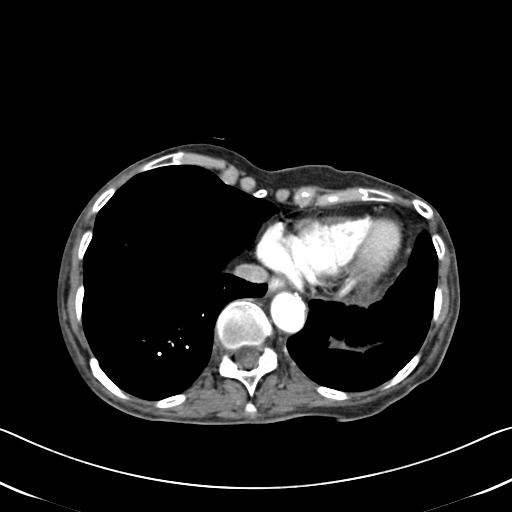
[im 67/139  mediastinal]
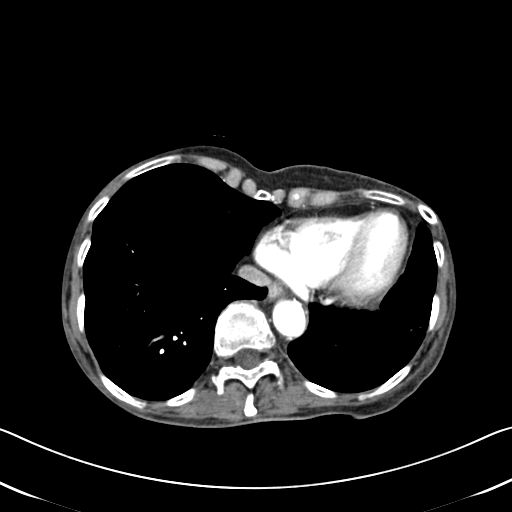
[im 74/139  mediastinal]
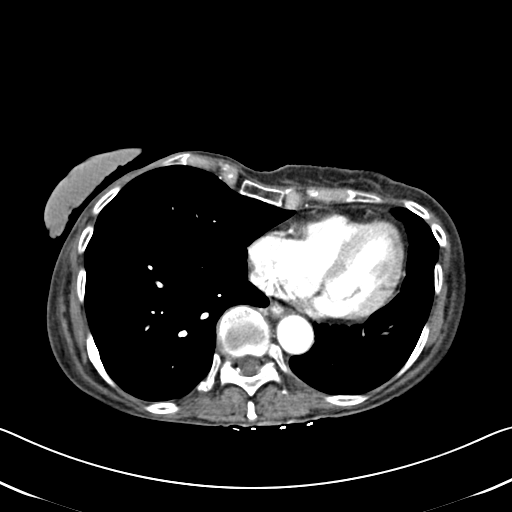
[im 83/139  mediastinal]
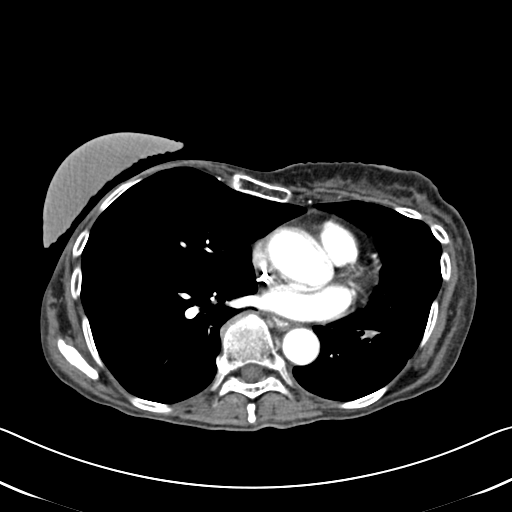
[im 83/139  bone]
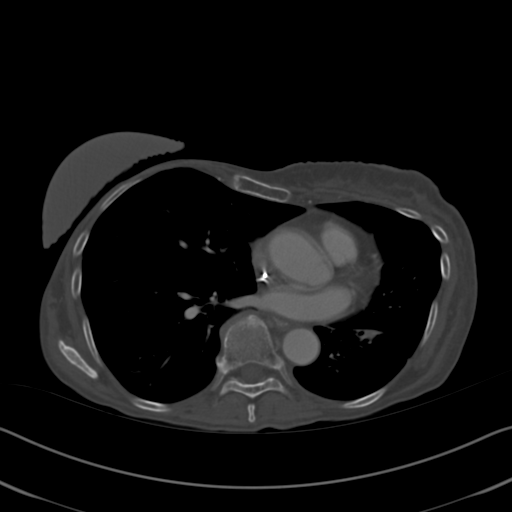
[im 93/139  mediastinal]
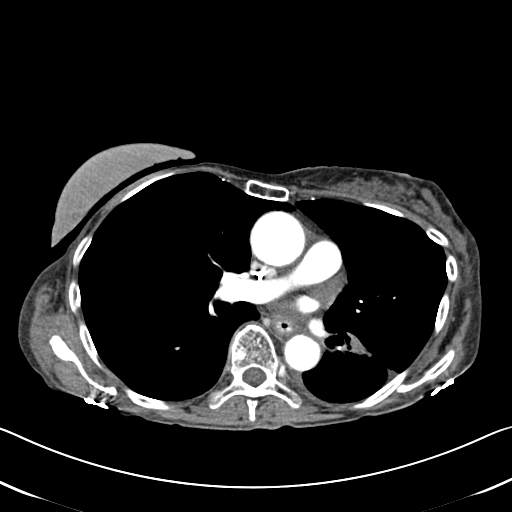
[im 102/139  lung]
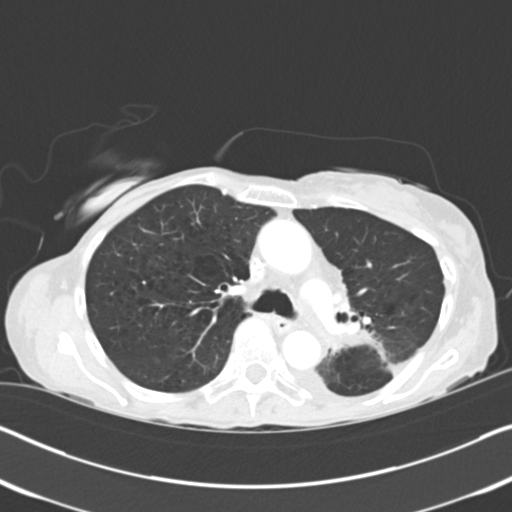
[im 111/139  mediastinal]
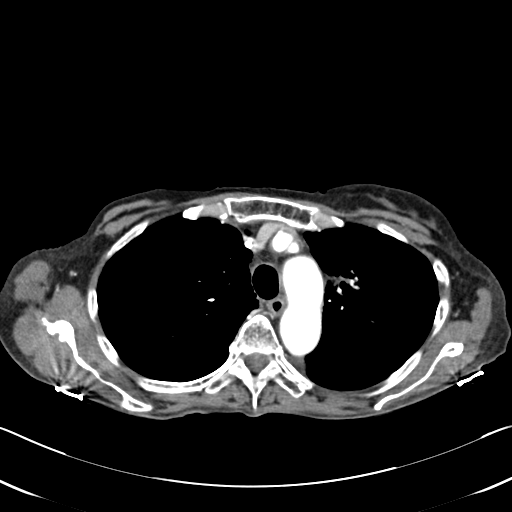
[im 111/139  lung]
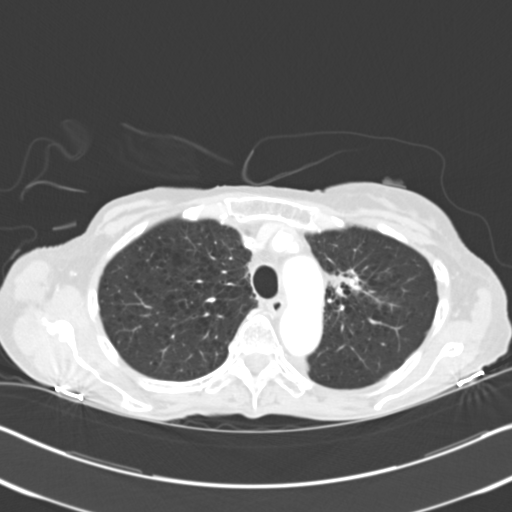
[im 120/139  mediastinal]
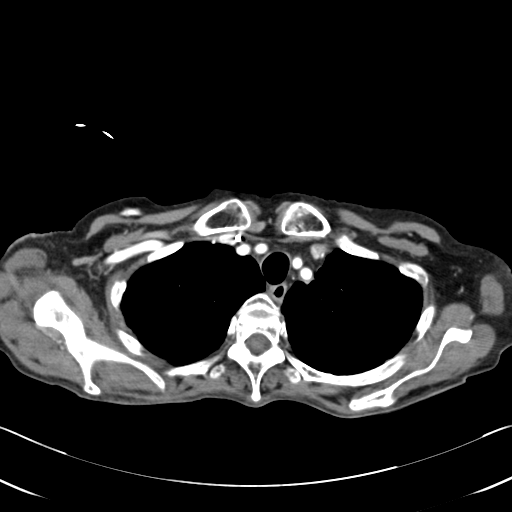
[im 120/139  lung]
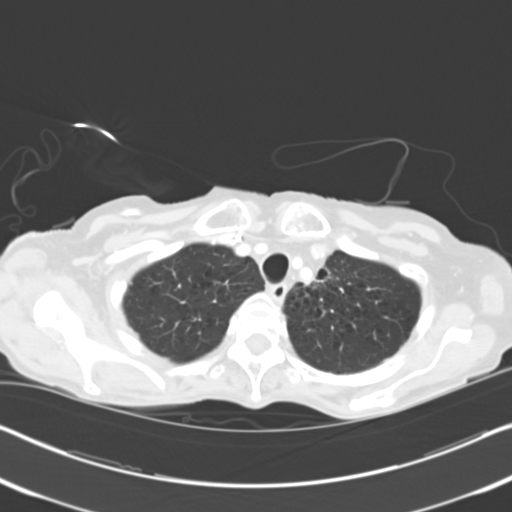
[im 129/139  mediastinal]
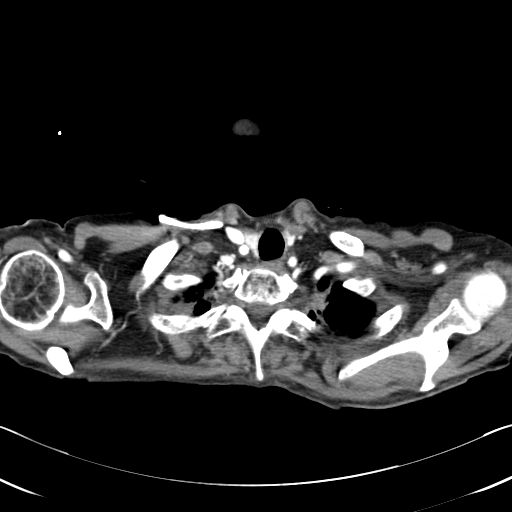
[im 129/139  lung]
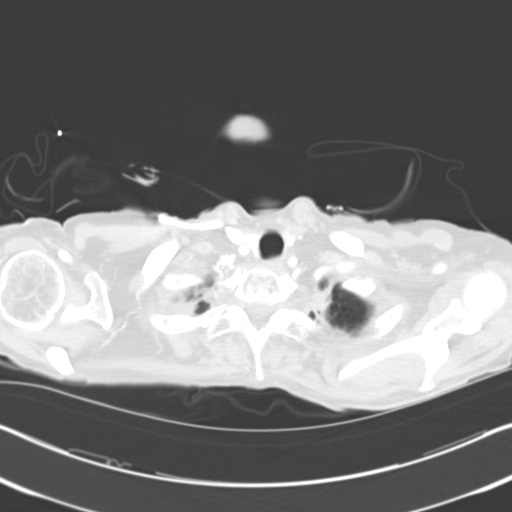

[14 of 29 positions shown; findings below may reference images not displayed]

FINDINGS: Standard contrast enhanced CT performed 75 cc of [id number].
Thoracic aorta is unremarkable. Central line in good position. Mediastinum
is stable. The pulmonary arteries are normal. No pulmonary embolus. Left
perihilar density is again present and stable in appearance and size with
measures of 1.7 x 1.3 cm. This is seen on image #43. Thickening of the
adjacent the left major fissure is unchanged. Present identified pulmonary
nodule in the right lung bases increased significantly in size and now
measures 12 cm x 1.7 cm, increased in size from prior study on which it
measured 1.3 x 1.7 cm. This is seen on image #68 lung windows. Lesion
identified pulmonary nodule seen on prior lung window image #59 now is
identified on lung window image #63 and is increased in size and now
measures 7.7 mm x 6.4 mm increased in size from prior measuring 6.3 mm in
maximum diameter. Please identified pulmonary nodule in the right upper lobe
previously measuring a 0.1 mm in maximum diameter has increased in size and
is now seen on image #32 measures 1.3-1.3 cm. Please identified nodule in
the lingula has increased in size from 7.2 mm in maximum diameter 2 1-cm by
a 0.5 mm. Is identified 4.4 liver nodule in right lung base is stable.
Adrenals are normal.

Liver normal. Gallbladder nondistended. Pancreas normal. No biliary
distention. Spleen normal. Simple cysts in the kidneys. Abdominal aorta
normal caliber. No bowel distention. No adenopathy. Right mastectomy.
Sclerotic densities noted vertebral bodies. Blastic metastatic disease
cannot be excluded.
IMPRESSION: 1. Progressive of pulmonary metastatic disease.
2. Cannot exclude multifocal blastic metastatic disease in the thoracolumbar
spine.
3. Right mastectomy.

## 2016-03-04 IMAGING — CR DG CHEST 2V
1 series · 2 of 2 positions shown · non-contrast
Comparison: 10/06/2013

CLINICAL DATA: Lung cancer.  Increased shortness of breath and CHF.

EXAM:
CHEST  2 VIEW

[Series 1: w chest pa · 0.14mm/px · 2 of 2 slices shown]
[im 1/2]
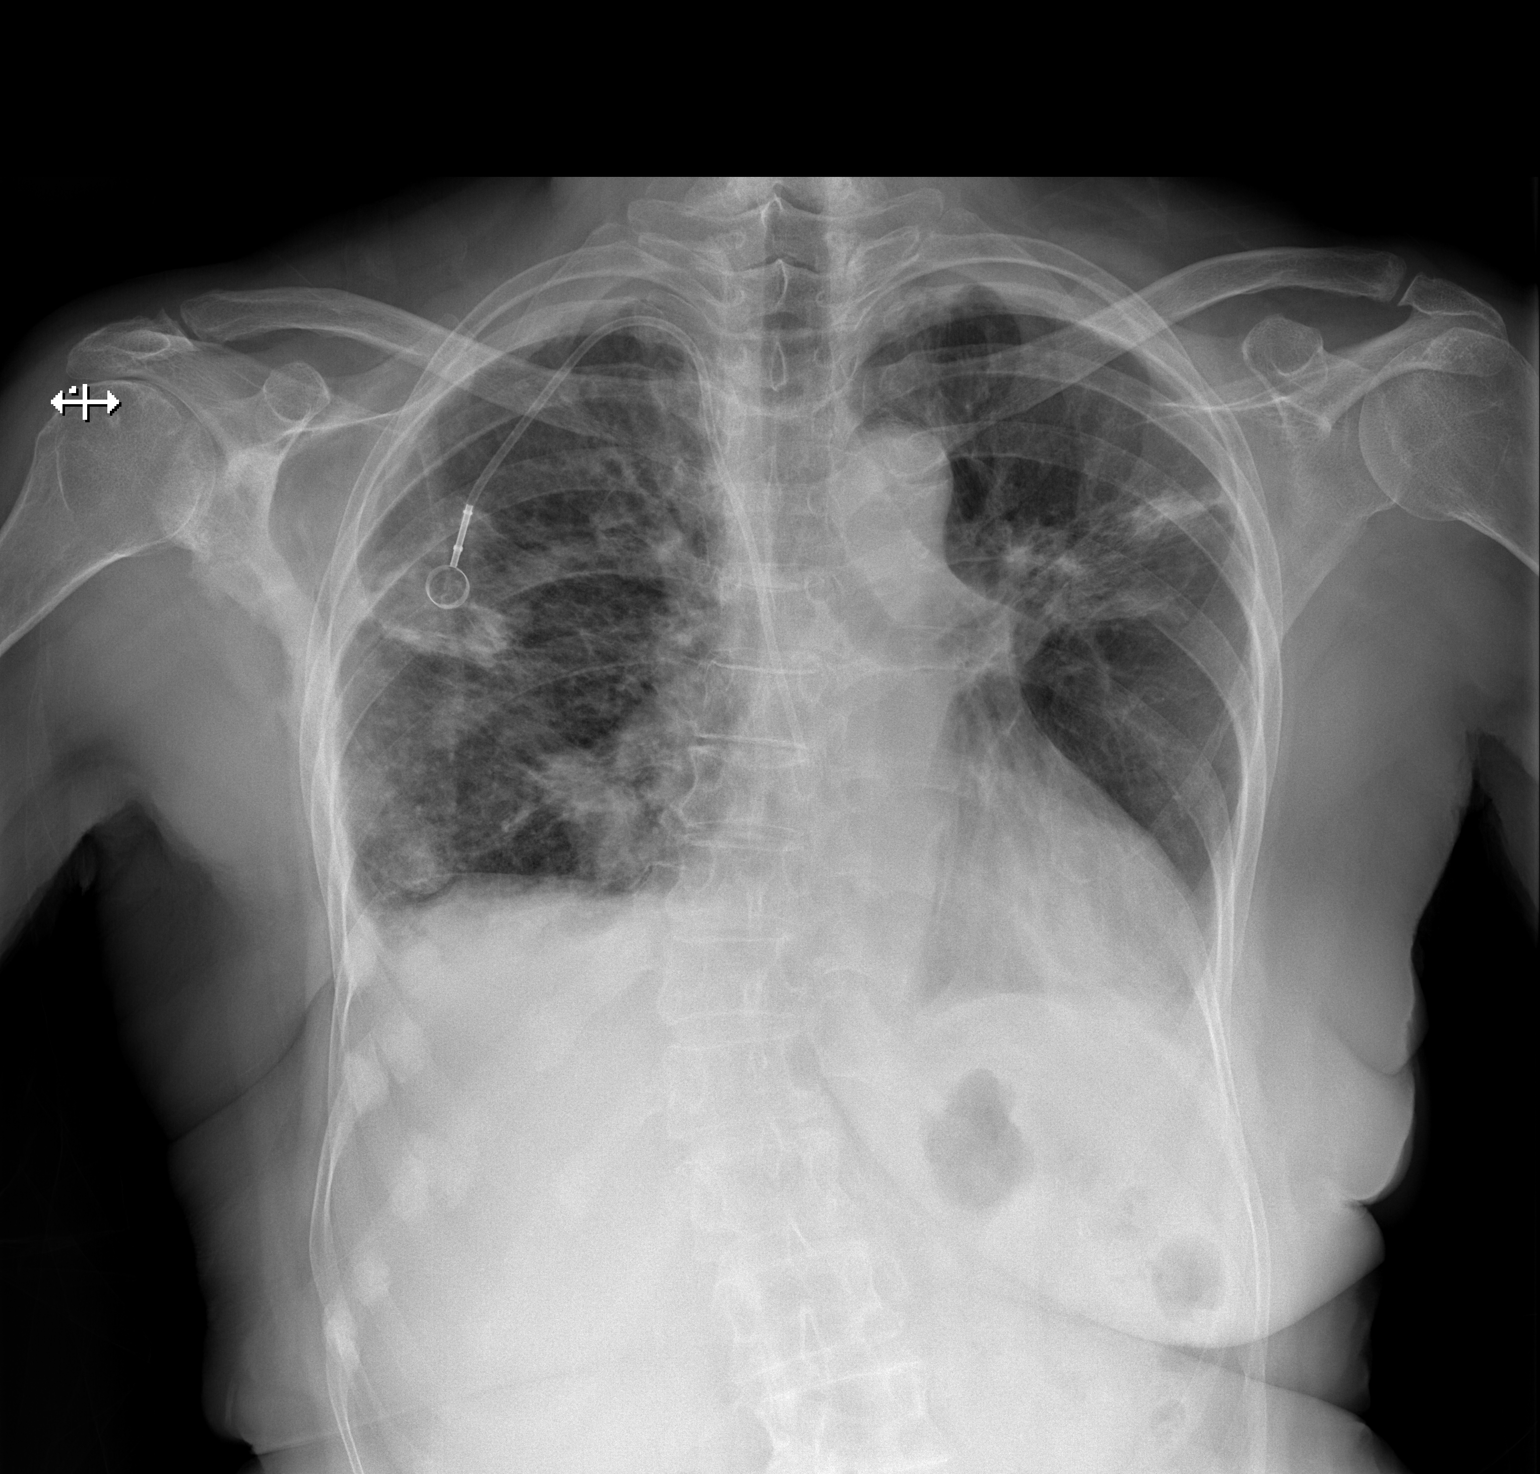
[im 2/2]
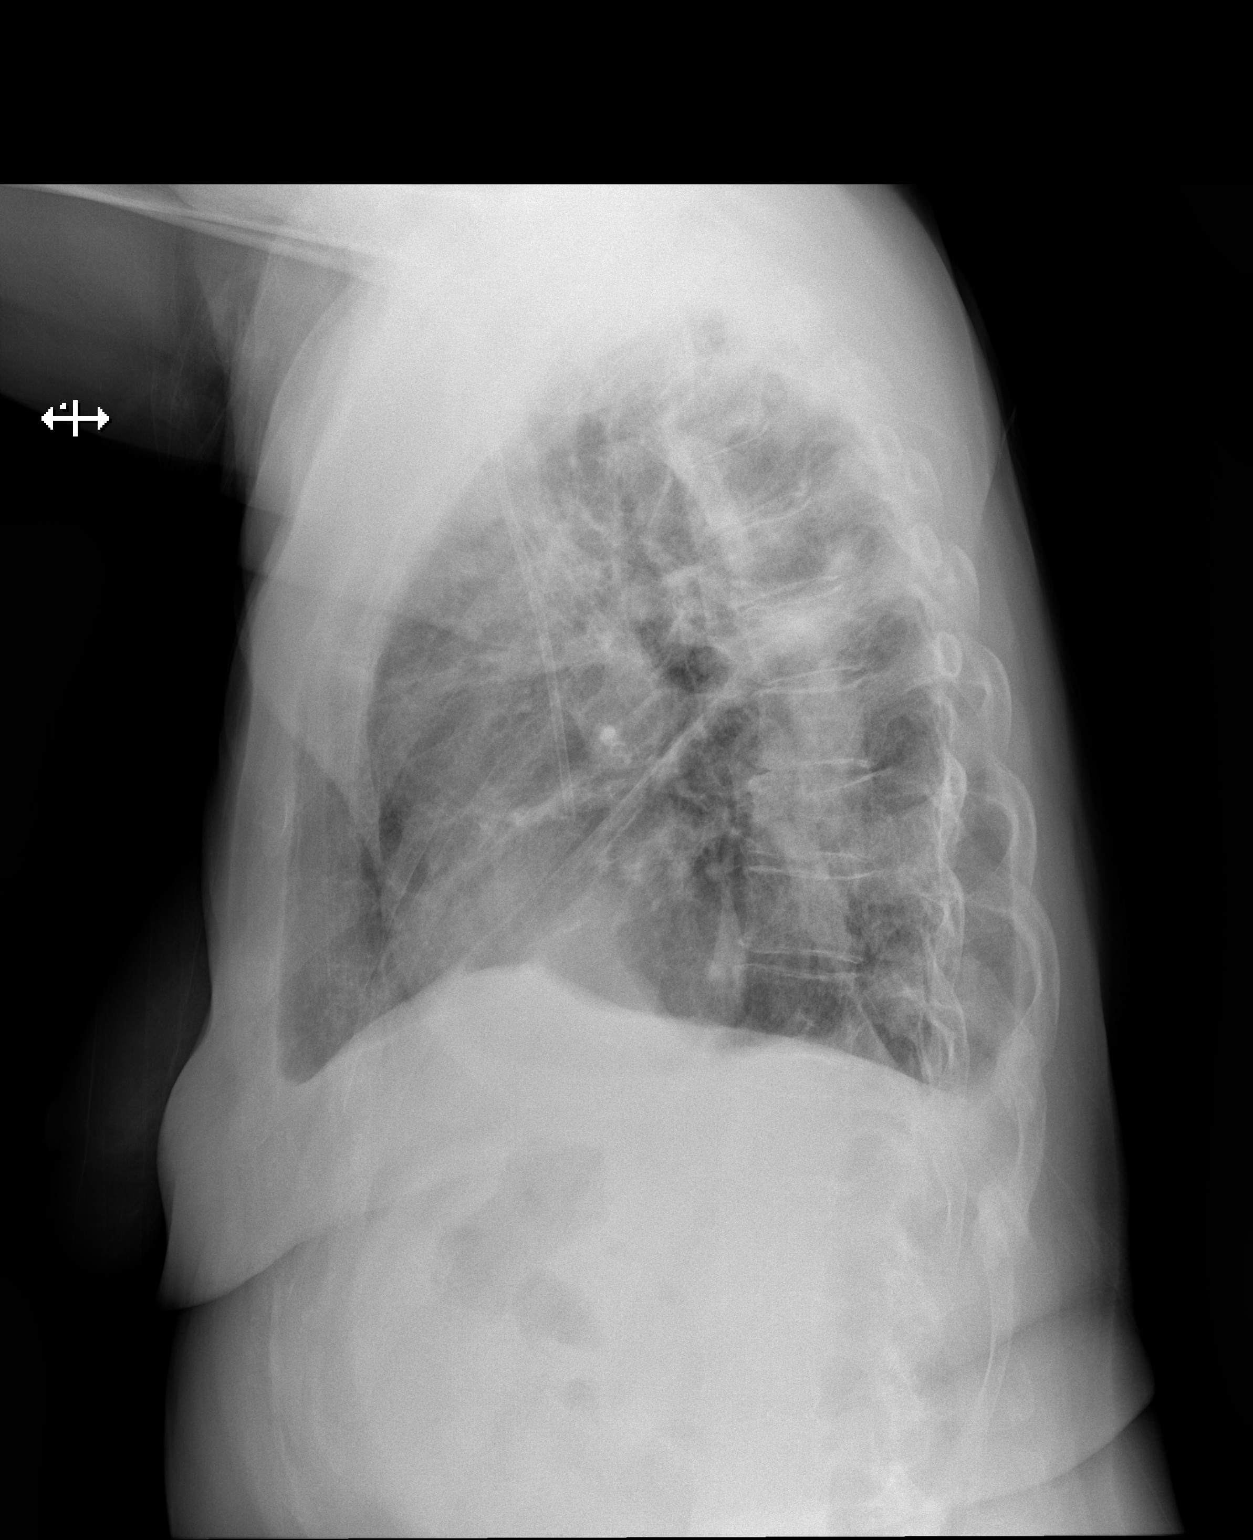

[2 of 2 positions shown; findings below may reference images not displayed]

FINDINGS: Right chest wall port a catheter is noted with tip in the projection
of the cavoatrial junction. There is mild cardiac enlargement. Lung
volumes are low and there is slight asymmetric elevation of the
right hemidiaphragm. Bilateral pulmonary opacities are similar to
previous exam. There is a small right pleural effusion noted. Lytic
lesion involving the right scapula is again noted and appears
unchanged from previous exam. Multiple healing right rib fractures
are also again noted.
IMPRESSION: 1. Low lung volumes.
2. No significant change in bilateral pulmonary opacities. Findings
likely reflect a combination of posttreatment changes and metastatic
disease. Recommend followup CT of the chest to assess for extent of
residual/recurrent tumor.
